# Patient Record
Sex: Female | Born: 1975 | Hispanic: No | Marital: Married | Smoking: Never smoker
Health system: Southern US, Community
[De-identification: ages and names within clinical notes are randomized; demographics above are authoritative.]

## PROBLEM LIST (undated history)

## (undated) DIAGNOSIS — N200 Calculus of kidney: Secondary | ICD-10-CM

## (undated) DIAGNOSIS — D649 Anemia, unspecified: Secondary | ICD-10-CM

## (undated) DIAGNOSIS — E039 Hypothyroidism, unspecified: Secondary | ICD-10-CM

## (undated) DIAGNOSIS — E079 Disorder of thyroid, unspecified: Secondary | ICD-10-CM

## (undated) HISTORY — PX: OTHER SURGICAL HISTORY: SHX169

---

## 2008-02-19 LAB — CONVERTED CEMR LAB: Pap Smear: NORMAL

## 2008-11-07 ENCOUNTER — Emergency Department: Payer: Self-pay | Admitting: Emergency Medicine

## 2008-11-24 ENCOUNTER — Emergency Department: Payer: Self-pay | Admitting: Emergency Medicine

## 2009-05-06 ENCOUNTER — Ambulatory Visit: Payer: Self-pay | Admitting: Family Medicine

## 2009-05-06 DIAGNOSIS — K644 Residual hemorrhoidal skin tags: Secondary | ICD-10-CM | POA: Insufficient documentation

## 2009-05-06 DIAGNOSIS — H9319 Tinnitus, unspecified ear: Secondary | ICD-10-CM | POA: Insufficient documentation

## 2009-05-06 DIAGNOSIS — D509 Iron deficiency anemia, unspecified: Secondary | ICD-10-CM | POA: Insufficient documentation

## 2009-06-29 ENCOUNTER — Ambulatory Visit: Payer: Self-pay | Admitting: Family Medicine

## 2009-07-20 ENCOUNTER — Ambulatory Visit: Payer: Self-pay | Admitting: Family Medicine

## 2009-07-20 LAB — CONVERTED CEMR LAB
Albumin: 4.1 g/dL (ref 3.5–5.2)
BUN: 17 mg/dL (ref 6–23)
Basophils Relative: 0.1 % (ref 0.0–3.0)
Bilirubin, Direct: 0.1 mg/dL (ref 0.0–0.3)
Chloride: 108 meq/L (ref 96–112)
Cholesterol: 131 mg/dL (ref 0–200)
Eosinophils Absolute: 0 10*3/uL (ref 0.0–0.7)
HCT: 37.6 % (ref 36.0–46.0)
LDL Cholesterol: 79 mg/dL (ref 0–99)
Lymphs Abs: 1.3 10*3/uL (ref 0.7–4.0)
MCHC: 34.3 g/dL (ref 30.0–36.0)
MCV: 89.3 fL (ref 78.0–100.0)
Monocytes Absolute: 0.4 10*3/uL (ref 0.1–1.0)
Neutro Abs: 3.8 10*3/uL (ref 1.4–7.7)
Neutrophils Relative %: 69.4 % (ref 43.0–77.0)
Potassium: 4.2 meq/L (ref 3.5–5.1)
RBC: 4.2 M/uL (ref 3.87–5.11)
Total Protein: 7 g/dL (ref 6.0–8.3)

## 2009-07-27 ENCOUNTER — Other Ambulatory Visit: Admission: RE | Admit: 2009-07-27 | Discharge: 2009-07-27 | Payer: Self-pay | Admitting: Family Medicine

## 2009-07-27 ENCOUNTER — Ambulatory Visit: Payer: Self-pay | Admitting: Family Medicine

## 2009-07-27 ENCOUNTER — Encounter: Payer: Self-pay | Admitting: Family Medicine

## 2009-07-27 DIAGNOSIS — J309 Allergic rhinitis, unspecified: Secondary | ICD-10-CM | POA: Insufficient documentation

## 2009-08-02 ENCOUNTER — Encounter (INDEPENDENT_AMBULATORY_CARE_PROVIDER_SITE_OTHER): Payer: Self-pay | Admitting: *Deleted

## 2009-09-29 ENCOUNTER — Ambulatory Visit: Payer: Self-pay | Admitting: Obstetrics & Gynecology

## 2009-09-29 LAB — CONVERTED CEMR LAB
Hemoglobin: 13.3 g/dL (ref 12.0–15.0)
Hepatitis B Surface Ag: NEGATIVE
Lymphocytes Relative: 16 % (ref 12–46)
Monocytes Absolute: 0.8 10*3/uL (ref 0.1–1.0)
Monocytes Relative: 9 % (ref 3–12)
Neutro Abs: 6.5 10*3/uL (ref 1.7–7.7)
Neutrophils Relative %: 75 % (ref 43–77)
RBC: 4.63 M/uL (ref 3.87–5.11)
Rh Type: POSITIVE
WBC: 8.8 10*3/uL (ref 4.0–10.5)

## 2009-10-04 ENCOUNTER — Ambulatory Visit: Payer: Self-pay | Admitting: Obstetrics and Gynecology

## 2009-10-04 LAB — CONVERTED CEMR LAB: Chlamydia, Swab/Urine, PCR: NEGATIVE

## 2009-10-28 ENCOUNTER — Inpatient Hospital Stay (HOSPITAL_COMMUNITY): Admission: AD | Admit: 2009-10-28 | Discharge: 2009-10-28 | Payer: Self-pay | Admitting: Obstetrics & Gynecology

## 2009-11-01 ENCOUNTER — Ambulatory Visit: Payer: Self-pay | Admitting: Obstetrics and Gynecology

## 2009-11-01 LAB — CONVERTED CEMR LAB
Hemoglobin: 13.1 g/dL (ref 12.0–15.0)
MCHC: 33.2 g/dL (ref 30.0–36.0)
MCV: 88.4 fL (ref 78.0–100.0)
RBC: 4.47 M/uL (ref 3.87–5.11)
WBC: 9.4 10*3/uL (ref 4.0–10.5)

## 2009-11-08 ENCOUNTER — Ambulatory Visit (HOSPITAL_COMMUNITY): Admission: RE | Admit: 2009-11-08 | Discharge: 2009-11-08 | Payer: Self-pay | Admitting: Obstetrics & Gynecology

## 2009-11-09 ENCOUNTER — Ambulatory Visit: Payer: Self-pay | Admitting: Obstetrics & Gynecology

## 2009-12-07 ENCOUNTER — Ambulatory Visit: Payer: Self-pay | Admitting: Obstetrics & Gynecology

## 2009-12-28 ENCOUNTER — Ambulatory Visit (HOSPITAL_COMMUNITY): Admission: RE | Admit: 2009-12-28 | Discharge: 2009-12-28 | Payer: Self-pay | Admitting: Obstetrics & Gynecology

## 2010-01-04 ENCOUNTER — Ambulatory Visit: Payer: Self-pay | Admitting: Obstetrics and Gynecology

## 2010-02-01 ENCOUNTER — Ambulatory Visit: Payer: Self-pay | Admitting: Obstetrics and Gynecology

## 2010-03-01 ENCOUNTER — Ambulatory Visit: Payer: Self-pay | Admitting: Obstetrics & Gynecology

## 2010-03-01 LAB — CONVERTED CEMR LAB
HCT: 36.3 % (ref 36.0–46.0)
MCHC: 31.1 g/dL (ref 30.0–36.0)
MCV: 96.8 fL (ref 78.0–100.0)
RBC: 3.75 M/uL — ABNORMAL LOW (ref 3.87–5.11)

## 2010-03-22 ENCOUNTER — Ambulatory Visit: Payer: Self-pay | Admitting: Obstetrics & Gynecology

## 2010-04-09 IMAGING — US US OB COMP LESS 14 WK
1 series · 14 of 28 positions shown · non-contrast
Comparison: none

OBSTETRICAL ULTRASOUND:
 This ultrasound exam was performed in the [HOSPITAL] Ultrasound Department.  The OB US report was generated in the AS system, and faxed to the ordering physician.  This report is also available in [HOSPITAL]?s Access[employer] and in [REDACTED] PACS.

[Series 1: us ob comp less 14 wks · 0.16mm/px · 32 acquisitions, 14 frames shown]
[im 2/32]
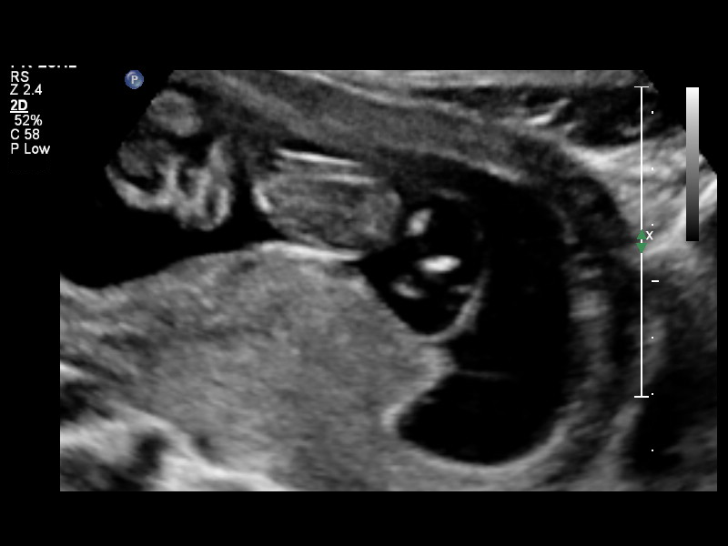
[im 4/32]
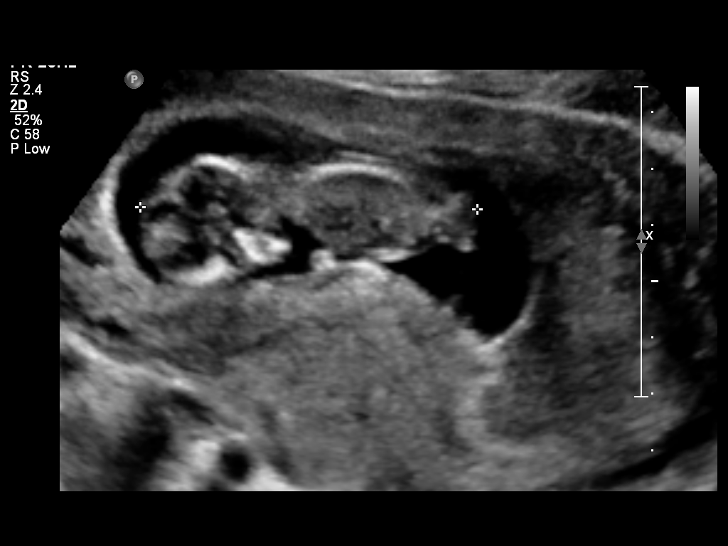
[im 6/32]
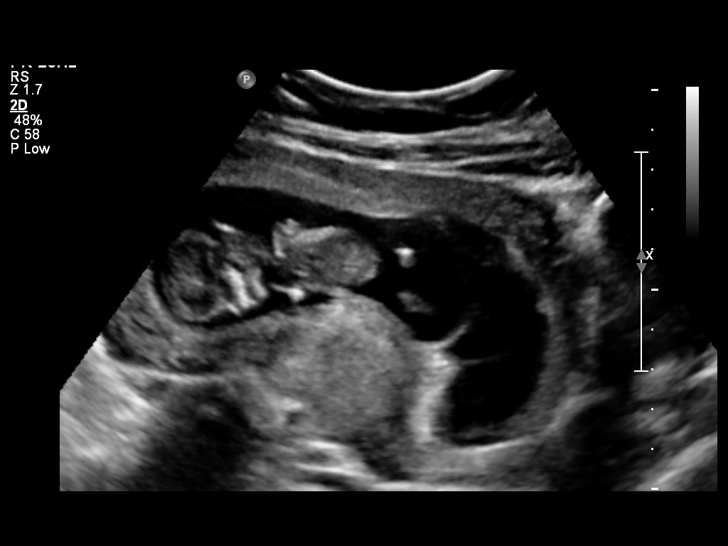
[im 9/32]
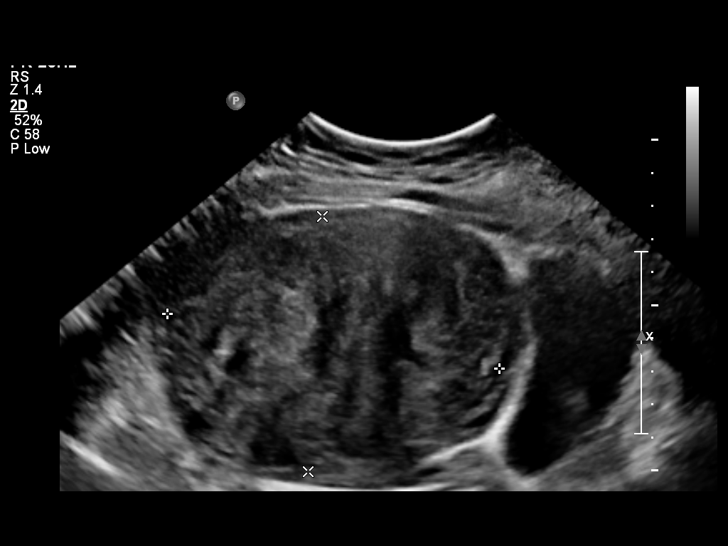
[im 11/32]
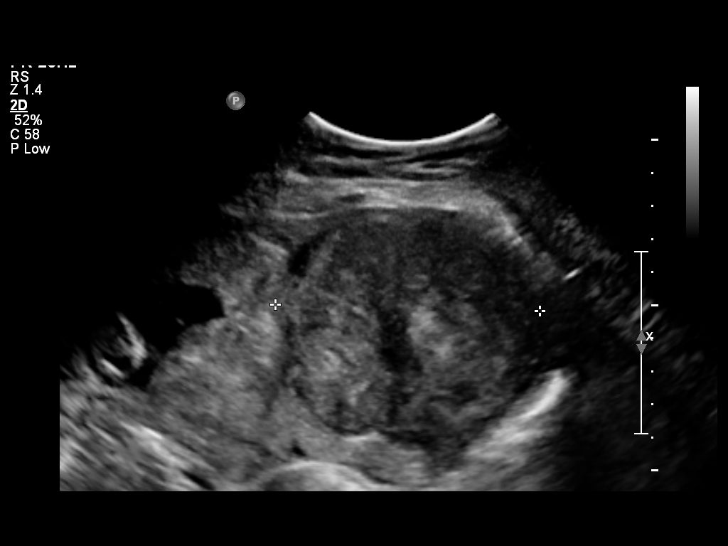
[im 13/32]
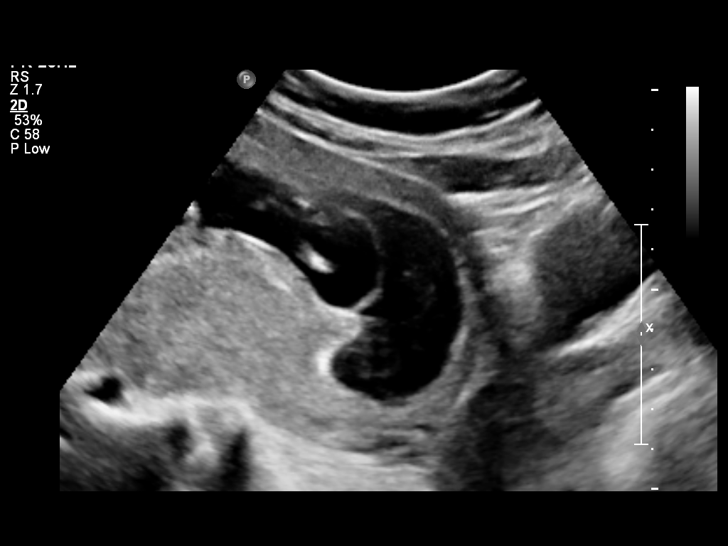
[im 15/32]
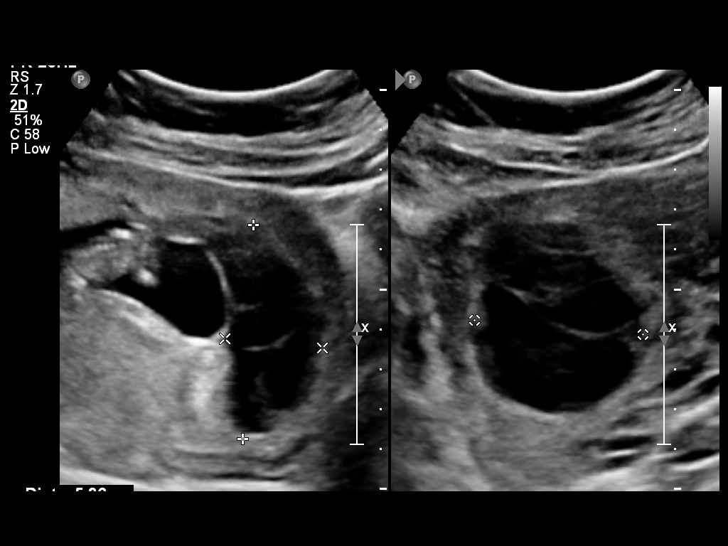
[im 18/32]
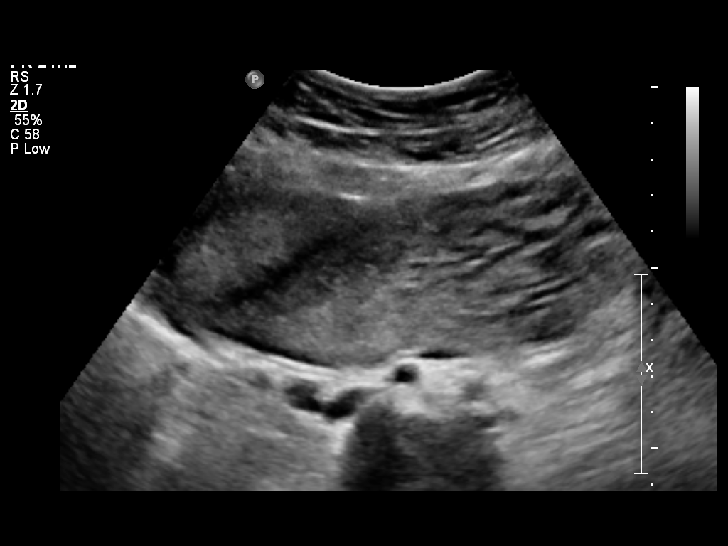
[im 20/32]
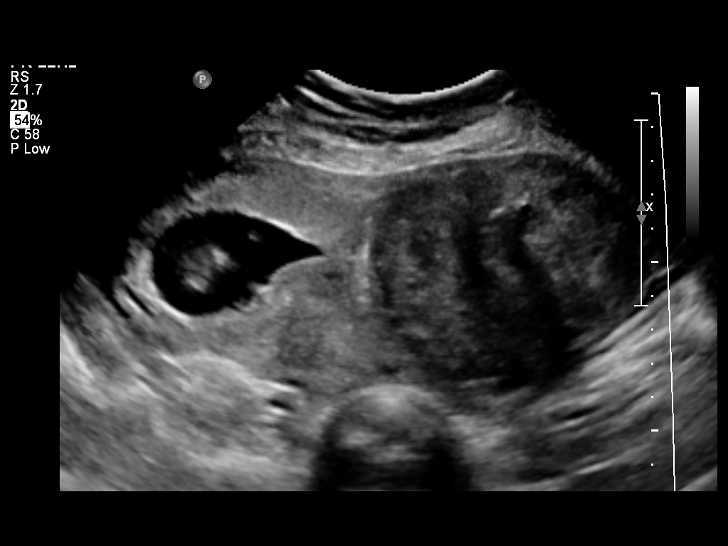
[im 22/32]
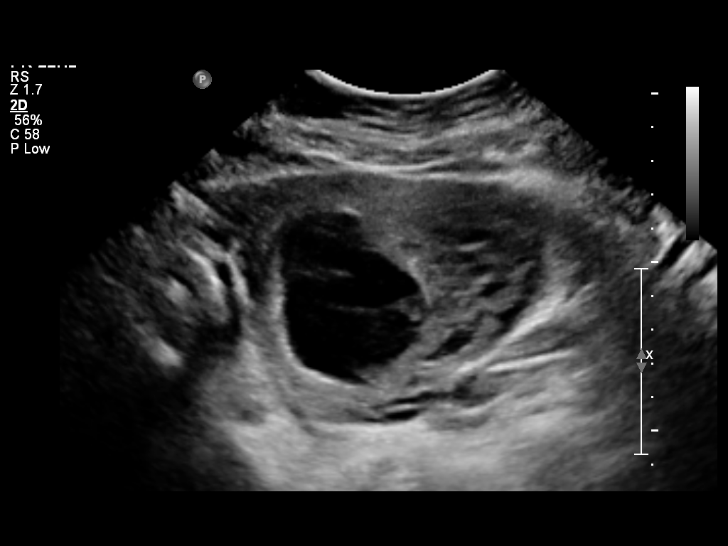
[im 25/32]
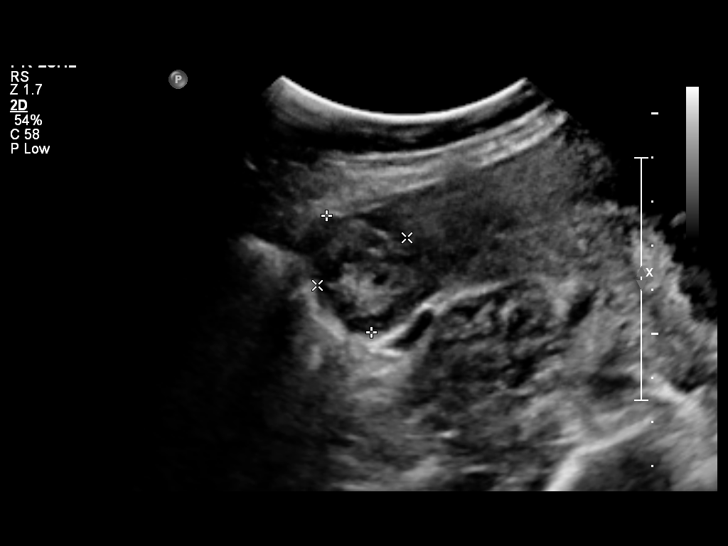
[im 27/32]
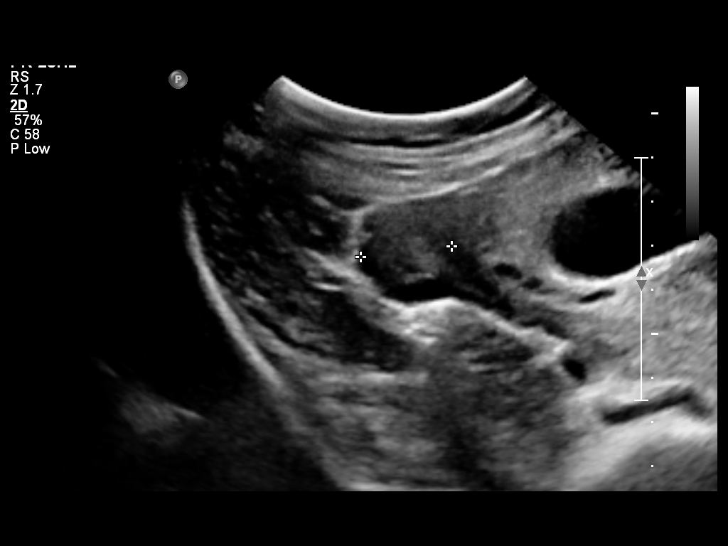
[im 29/32]
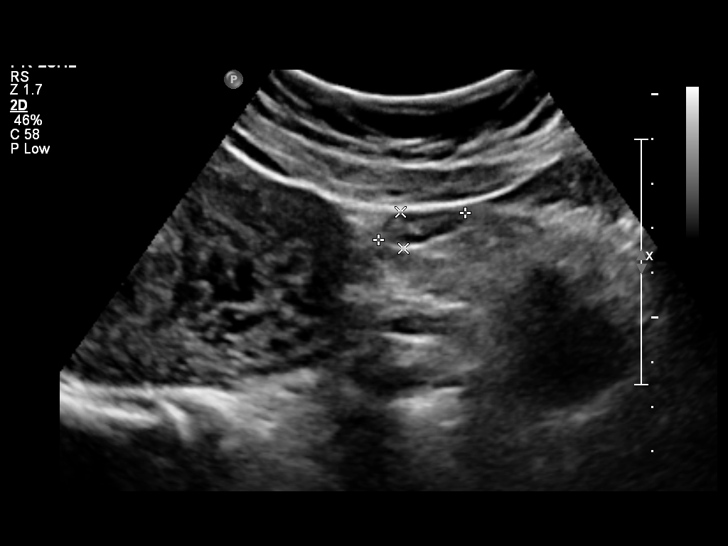
[im 32/32]
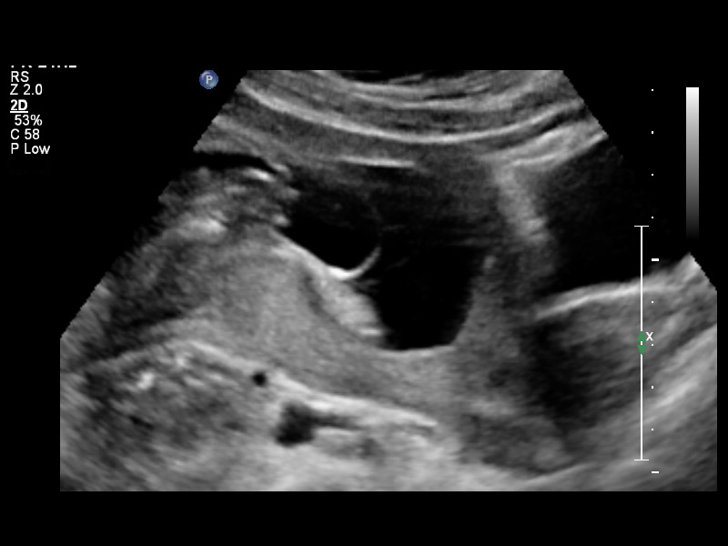

[14 of 28 positions shown; findings below may reference images not displayed]

IMPRESSION: See AS Obstetric US report.

## 2010-04-12 ENCOUNTER — Ambulatory Visit: Payer: Self-pay | Admitting: Family Medicine

## 2010-04-26 ENCOUNTER — Ambulatory Visit: Payer: Self-pay | Admitting: Obstetrics & Gynecology

## 2010-04-26 ENCOUNTER — Telehealth: Payer: Self-pay | Admitting: Family Medicine

## 2010-04-26 LAB — CONVERTED CEMR LAB: Yeast Wet Prep HPF POC: NONE SEEN

## 2010-05-03 ENCOUNTER — Ambulatory Visit: Payer: Self-pay | Admitting: Obstetrics & Gynecology

## 2010-05-10 ENCOUNTER — Ambulatory Visit: Payer: Self-pay | Admitting: Obstetrics & Gynecology

## 2010-05-11 ENCOUNTER — Ambulatory Visit: Payer: Self-pay | Admitting: Obstetrics & Gynecology

## 2010-05-12 ENCOUNTER — Inpatient Hospital Stay (HOSPITAL_COMMUNITY): Admission: AD | Admit: 2010-05-12 | Discharge: 2010-05-15 | Payer: Self-pay | Admitting: Obstetrics & Gynecology

## 2010-05-12 ENCOUNTER — Ambulatory Visit: Payer: Self-pay | Admitting: Obstetrics & Gynecology

## 2010-05-24 ENCOUNTER — Ambulatory Visit: Payer: Self-pay | Admitting: Family Medicine

## 2010-06-14 ENCOUNTER — Ambulatory Visit: Payer: Self-pay | Admitting: Obstetrics & Gynecology

## 2010-10-25 ENCOUNTER — Ambulatory Visit: Payer: Self-pay | Admitting: Family Medicine

## 2010-11-18 ENCOUNTER — Telehealth (INDEPENDENT_AMBULATORY_CARE_PROVIDER_SITE_OTHER): Payer: Self-pay | Admitting: *Deleted

## 2010-11-22 ENCOUNTER — Ambulatory Visit
Admission: RE | Admit: 2010-11-22 | Discharge: 2010-11-22 | Payer: Self-pay | Source: Home / Self Care | Attending: Family Medicine | Admitting: Family Medicine

## 2010-11-22 LAB — CONVERTED CEMR LAB
ALT: 13 units/L (ref 0–35)
AST: 16 units/L (ref 0–37)
Alkaline Phosphatase: 70 units/L (ref 39–117)
BUN: 20 mg/dL (ref 6–23)
Basophils Relative: 0.3 % (ref 0.0–3.0)
Chloride: 104 meq/L (ref 96–112)
Cholesterol: 171 mg/dL (ref 0–200)
Eosinophils Relative: 0.5 % (ref 0.0–5.0)
Glucose, Bld: 76 mg/dL (ref 70–99)
Hemoglobin: 12.9 g/dL (ref 12.0–15.0)
LDL Cholesterol: 103 mg/dL — ABNORMAL HIGH (ref 0–99)
Lymphocytes Relative: 26.5 % (ref 12.0–46.0)
MCHC: 33.6 g/dL (ref 30.0–36.0)
Monocytes Relative: 7 % (ref 3.0–12.0)
Neutro Abs: 3.8 10*3/uL (ref 1.4–7.7)
Potassium: 4.3 meq/L (ref 3.5–5.1)
RBC: 4.22 M/uL (ref 3.87–5.11)
Sodium: 139 meq/L (ref 135–145)
Total Bilirubin: 0.5 mg/dL (ref 0.3–1.2)
Total CHOL/HDL Ratio: 3
WBC: 5.8 10*3/uL (ref 4.5–10.5)

## 2010-11-25 ENCOUNTER — Ambulatory Visit
Admission: RE | Admit: 2010-11-25 | Discharge: 2010-11-25 | Payer: Self-pay | Source: Home / Self Care | Attending: Family Medicine | Admitting: Family Medicine

## 2010-11-25 ENCOUNTER — Other Ambulatory Visit: Payer: Self-pay | Admitting: Family Medicine

## 2010-11-25 LAB — TSH: TSH: 7.27 u[IU]/mL — ABNORMAL HIGH (ref 0.35–5.50)

## 2010-11-25 LAB — B12 AND FOLATE PANEL
Folate: >20 ng/mL
Vitamin B-12: 631 pg/mL (ref 211–911)

## 2010-11-30 ENCOUNTER — Ambulatory Visit
Admission: RE | Admit: 2010-11-30 | Discharge: 2010-11-30 | Payer: Self-pay | Source: Home / Self Care | Attending: Family Medicine | Admitting: Family Medicine

## 2010-11-30 ENCOUNTER — Other Ambulatory Visit: Payer: Self-pay | Admitting: Family Medicine

## 2010-11-30 LAB — T3, FREE: T3, Free: 2 pg/mL — ABNORMAL LOW (ref 2.3–4.2)

## 2010-11-30 LAB — T4, FREE: Free T4: 0.6 ng/dL (ref 0.60–1.60)

## 2010-12-03 DIAGNOSIS — E039 Hypothyroidism, unspecified: Secondary | ICD-10-CM | POA: Insufficient documentation

## 2010-12-20 ENCOUNTER — Encounter: Payer: Self-pay | Admitting: Family Medicine

## 2010-12-20 NOTE — Progress Notes (Signed)
Summary: regarding newborn appt  Phone Note Call from Patient Call back at Home Phone [phone]   Caller: Hulen Skains Call For: Kerby Nora MD Summary of Call: Pt is due to deliver in july and husband is asking if you will see the baby as a new pt.  Do they just call  when the baby is born to schedule appt? Initial call taken by: Lowella Petties CMA,  April 26, 2010 11:33 AM  Follow-up for Phone Call        Yes I would love to see her. yes just call when baby born for the appt in 1 week.  Follow-up by: Kerby Nora MD,  April 26, 2010 12:01 PM  Additional Follow-up for Phone Call Additional follow up Details #1::        Patient advised.Consuello Masse CMA  Additional Follow-up by: Benny Lennert CMA Duncan Dull),  April 26, 2010 2:03 PM

## 2010-12-22 NOTE — Assessment & Plan Note (Signed)
Summary: CPX/RBH   Vital Signs:  Patient profile:   35 year old female Height:      63.75 inches Weight:      120.50 pounds BMI:     20.92 Temp:     98.0 degrees F oral Pulse rate:   72 / minute Pulse rhythm:   regular BP sitting:   100 / 70  (left arm) Cuff size:   regular  Vitals Entered By: Benny Lennert CMA Duncan Dull) (November 25, 2010 2:00 PM)  History of Present Illness: Chief complaint dizziness   English is second language for pt.    In last 2 months  ... dizziness when getting up in AMs. Feels like room is spinning.  Continues more mildly through rest of day. Difficult to determine if movement worsens. No lightheadedness or syncope.  No cold symptoms or allergy. Occ skipping meals..but dizziness does not get worsen.  Has baby 10 months old.  Breast feeding. Having irregular menses...not heavy flow.  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 Take 2-3 Tablets By Mouth Daily 2)  Prenavite Multiple Vitamin 28-0.8 Mg Tabs (Prenatal Vit-Fe Fumarate-Fa) .... One Tablet Dialy  Allergies (verified): No Known Drug Allergies  Past History:  Past medical, surgical, family and social histories (including risk factors) reviewed, and no changes noted (except as noted below).  Past Medical History: Reviewed history from 05/06/2009 and no changes required. Anemia-iron deficiency  Past Surgical History: Reviewed history from 05/06/2009 and no changes required. left axillae surgery  age 67ish..? cyst removal misscarriage,   2010 D and C  Family History: Reviewed history from 05/06/2009 and no changes required. father: CVA, HTN, deceased mother :healthy sister: healthy PGF: CVA no cancer no CAD  Social History: Reviewed history from 05/06/2009 and no changes required. 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 CV:  Denies chest pain or discomfort. Resp:  Denies shortness of breath. GI:  Denies abdominal pain, bloody stools, constipation, and diarrhea. GU:  Denies dysuria.  Physical Exam  General:  Well-developed,well-nourished,in no acute distress; alert,appropriate and cooperative throughout examination Eyes:  No corneal or conjunctival inflammation noted. EOMI. Perrla. Funduscopic exam benign, without hemorrhages, exudates or papilledema. Vision grossly normal. Ears:  External ear exam shows no significant lesions or deformities.  Otoscopic examination reveals clear canals, tympanic membranes are intact bilaterally without bulging, retraction, inflammation or discharge. Hearing is grossly normal bilaterally. Nose:  External nasal examination shows no deformity or inflammation. Nasal mucosa are pink and moist without lesions or exudates. Mouth:  Oral mucosa and oropharynx without lesions or exudates.  Teeth in good repair. Neck:  no carotid bruit or thyromegaly no cervical or supraclavicular lymphadenopathy  Lungs:  Normal respiratory effort, chest expands symmetrically. Lungs are clear to auscultation, no crackles or wheezes. Heart:  Normal rate and regular rhythm. S1 and S2 normal  without gallop, murmur, click, rub or other extra sounds. Abdomen:  Bowel sounds positive,abdomen soft and non-tender without masses, organomegaly or hernias noted. Msk:  No  deformity or scoliosis noted of thoracic or lumbar spine.   Pulses:  R and L carotid,radial,femoral,dorsalis pedis and posterior tibial pulses are full and equal bilaterally Extremities:  No clubbing, cyanosis, edema, or deformity noted with normal full range of motion of all joints.   Neurologic:  No cranial nerve deficits noted. Station and gait are normal. Plantar reflexes are down-going bilaterally. DTRs are symmetrical throughout. Sensory, motor and coordinative functions appear intact. Skin:  Intact without suspicious lesions or rashes Psych:  Cognition and judgment appear intact. Alert and cooperative with normal attention span and concentration. No apparent delusions, illusions, hallucinations   Impression & Recommendations:  Problem # 1:  DIZZINESS (ICD-780.4)  Difficult to determine if vertigo or lightheadedness. cbc nml. Will send for TSh and vit D. Also encourage water and regular eating as she is breast feeding.   Neg Dix HAllpike  on exam but she describes most as room spinning sensation. If above not improving.. Consider ENT eval.   Orders: TLB-TSH (Thyroid Stimulating Hormone) (84443-TSH) TLB-B12 + Folate Pnl ([id number]-Y86/VHQ)  Complete Medication List: 1)  Fish Oil Oil (Fish oil) .... Take 2-3 tablets by mouth daily 2)  Prenavite Multiple Vitamin 28-0.8 Mg Tabs (Prenatal vit-fe fumarate-fa) .... One tablet dialy  Patient Instructions: 1)  Schedule CPX in 3 months for PAP.  No labs prior needed.  2)  Increase water. Don't skip meals.    Orders Added: 1)  Est. Patient Level III [46962] 2)  TLB-TSH (Thyroid Stimulating Hormone) [84443-TSH] 3)  TLB-B12 + Folate Pnl [[id number]-B12/FOL]    Current Allergies (reviewed today): No known allergies   Last PAP:  NEGATIVE FOR INTRAEPITHELIAL LESIONS OR MALIGNANCY. (07/27/2009 12:00:00 AM) PAP Result Date:  01/18/2010 PAP Result:  normal PAP Next Due:  1 yr

## 2010-12-22 NOTE — Progress Notes (Signed)
----   Converted from flag ---- ---- 11/17/2010 10:20 PM, Kerby Nora MD wrote: CMET, lipids, cbc Dx v77.91, 280.9  ---- 11/16/2010 9:56 AM, Liane Comber CMA (AAMA) wrote: Lab orders please! Good Morning! This pt is scheduled for cpx labs Monday, which labs to draw and dx codes to use? Thanks Tasha ------------------------------

## 2010-12-28 ENCOUNTER — Telehealth: Payer: Self-pay | Admitting: Family Medicine

## 2011-01-02 ENCOUNTER — Other Ambulatory Visit: Payer: Self-pay

## 2011-01-04 ENCOUNTER — Encounter (INDEPENDENT_AMBULATORY_CARE_PROVIDER_SITE_OTHER): Payer: Self-pay | Admitting: *Deleted

## 2011-01-04 ENCOUNTER — Other Ambulatory Visit (INDEPENDENT_AMBULATORY_CARE_PROVIDER_SITE_OTHER): Payer: BC Managed Care – PPO

## 2011-01-04 ENCOUNTER — Other Ambulatory Visit: Payer: Self-pay | Admitting: Family Medicine

## 2011-01-04 DIAGNOSIS — E039 Hypothyroidism, unspecified: Secondary | ICD-10-CM

## 2011-01-04 DIAGNOSIS — E079 Disorder of thyroid, unspecified: Secondary | ICD-10-CM

## 2011-01-04 LAB — TSH: TSH: 7.92 u[IU]/mL — ABNORMAL HIGH (ref 0.35–5.50)

## 2011-01-05 NOTE — Progress Notes (Signed)
Summary: headaches since starting thyroid medicine  Phone Note Call from Patient Call back at Home Phone [phone]   Caller: Hulen Skains Summary of Call: Pt has been taking levothyroxine for about 2-3 weeks and has been having headaches since starting- these happen in the afternoons and husband is asking if this is normal-  she didnt have headaches before. Initial call taken by: Lowella Petties CMA, AAMA,  December 28, 2010 1:07 PM  Follow-up for Phone Call        it is possible. since Dr. B here in AM, i would rather defer to her on whether or not she would change thryoid meds. Follow-up by: Hannah Beat MD,  December 28, 2010 1:54 PM  Additional Follow-up for Phone Call Additional follow up Details #1::        Lets bring her in early to check TSH to see if overtreated etc.  TSH Dx 244.9 Additional Follow-up by: Kerby Nora MD,  December 28, 2010 11:14 PM    Additional Follow-up for Phone Call Additional follow up Details #2::    Patients husband advised.Consuello Masse CMA   Follow-up by: Benny Lennert CMA Duncan Dull),  December 29, 2010 8:17 AM

## 2011-01-11 NOTE — Letter (Signed)
Summary: [facility] Patient Medical Records  [facility] Patient Medical Records   Imported By: Kassie Mends 01/02/2011 11:15:21  _____________________________________________________________________  External Attachment:    Type:   Image     Comment:   External Document

## 2011-02-05 LAB — CBC
HCT: 38.6 % (ref 36.0–46.0)
Hemoglobin: 12.7 g/dL (ref 12.0–15.0)
MCV: 96.7 fL (ref 78.0–100.0)
RBC: 3.99 MIL/uL (ref 3.87–5.11)
WBC: 9.4 10*3/uL (ref 4.0–10.5)

## 2011-02-21 LAB — URINE MICROSCOPIC-ADD ON

## 2011-02-21 LAB — WET PREP, GENITAL: Trich, Wet Prep: NONE SEEN

## 2011-02-21 LAB — CBC
HCT: 39.5 % (ref 36.0–46.0)
Hemoglobin: 13.4 g/dL (ref 12.0–15.0)
MCHC: 33.9 g/dL (ref 30.0–36.0)
Platelets: 197 10*3/uL (ref 150–400)
RDW: 14.7 % (ref 11.5–15.5)

## 2011-02-21 LAB — URINALYSIS, ROUTINE W REFLEX MICROSCOPIC
Glucose, UA: NEGATIVE mg/dL
Leukocytes, UA: NEGATIVE
Specific Gravity, Urine: 1.025 (ref 1.005–1.030)

## 2011-02-21 LAB — GC/CHLAMYDIA PROBE AMP, GENITAL: GC Probe Amp, Genital: NEGATIVE

## 2011-02-21 LAB — ABO/RH: ABO/RH(D): O POS

## 2011-02-21 LAB — POCT PREGNANCY, URINE: Preg Test, Ur: POSITIVE

## 2011-02-22 ENCOUNTER — Other Ambulatory Visit: Payer: Self-pay | Admitting: Family Medicine

## 2011-02-22 DIAGNOSIS — E039 Hypothyroidism, unspecified: Secondary | ICD-10-CM

## 2011-02-23 ENCOUNTER — Other Ambulatory Visit (INDEPENDENT_AMBULATORY_CARE_PROVIDER_SITE_OTHER): Payer: BC Managed Care – PPO | Admitting: Family Medicine

## 2011-02-23 DIAGNOSIS — E039 Hypothyroidism, unspecified: Secondary | ICD-10-CM

## 2011-02-23 LAB — TSH: TSH: 6.73 u[IU]/mL — ABNORMAL HIGH (ref 0.35–5.50)

## 2011-02-25 ENCOUNTER — Telehealth: Payer: Self-pay | Admitting: Family Medicine

## 2011-02-25 DIAGNOSIS — E039 Hypothyroidism, unspecified: Secondary | ICD-10-CM

## 2011-02-25 NOTE — Telephone Encounter (Signed)
Notify pt that thyroid is better but still under treated. Will increase thyroid medication  But find out what dose she is on currently.[name] find on last LAB result. Let me know so I can send in new rx... Also enter pharmacy please. Schedule TSH in 4-6 weeks... Order entered.

## 2011-03-01 NOTE — Telephone Encounter (Signed)
Notified patients husband he will ask wife what dosage medication she is on and they use [facility] [address] and they will schedule follow up labs in 4-6 weeks

## 2011-03-03 NOTE — Telephone Encounter (Signed)
Have they called back with dosage yet? If not please call again.

## 2011-03-08 MED ORDER — LEVOTHYROXINE SODIUM 50 MCG PO TABS: 50.0000 ug | ORAL_TABLET | Freq: Every day | ORAL | Status: DC

## 2011-03-08 NOTE — Telephone Encounter (Signed)
Patient is currently taken 25 mg of thyroid medication

## 2011-03-08 NOTE — Telephone Encounter (Signed)
New prescription for higher dose sent in. Make sure appt for 4-6 week TSH scheduled. I put order in EMR.

## 2011-03-10 NOTE — Telephone Encounter (Signed)
Patients husband advised and will call back for appt

## 2011-04-04 NOTE — Assessment & Plan Note (Signed)
[name], [name] NO.:  [id number]   MEDICAL RECORD NO.:  [id number]          PATIENT TYPE:  POB   LOCATION:  [facility]         FACILITY:  [facility]   PHYSICIAN:  Tinnie Gens, MD        DATE OF BIRTH:  Sep 25, 1976   DATE OF SERVICE:  05/24/2010                                  CLINIC NOTE   CHIEF COMPLAINT:  Hemorrhoids.   HISTORY OF PRESENT ILLNESS:  The patient is a 35 year old Asian female  who is 2 weeks status post vaginal delivery of 7-pound and 0-ounce  female who is currently breast and bottle-feeding.  The patient has a  large amount of hemorrhoids and was having difficulty urinating;  however, her urination problem has resolved, but her hemorrhoids  remained.  She is using Tucks pads presently.   PHYSICAL EXAMINATION:  VITAL SIGNS:  Vitals are as noted in the chart.  GENERAL:  She is a well-developed, well-nourished female, in no acute  distress.  ABDOMEN:  Soft, nontender, nondistended.  GU:  Normal external female genitalia, healing midline episiotomy.  Urethra appears normal.  The rectum has got some moderate amount of soft  hemorrhoids externally that are still swollen.   IMPRESSION:  Moderate hemorrhoids.   PLAN:  ProctoFoam and discontinue sitz baths as needed, Tucks pads as  well.           ______________________________  Tinnie Gens, MD     TP/MEDQ  D:  05/24/2010  T:  05/25/2010  Job:  [id number]

## 2011-04-04 NOTE — Assessment & Plan Note (Signed)
[name], [name] NO.:  [id number]   MEDICAL RECORD NO.:  [id number]          PATIENT TYPE:  POB   LOCATION:  [facility]         [facility]AC[facility]  [facility]   PHYSICIAN:  Scheryl Darter, MD       DATE OF BIRTH:  1975/12/25   DATE OF SERVICE:                                  CLINIC NOTE   The patient's postpartum vaginal delivery on May 13, 2010.  She had a  vaginal delivery with a 7 pounds female, currently breast and bottle  feeding and baby is doing well.  She has had problems with hemorrhoids.  She notes some burning of the site of her midline episiotomy, but no  swelling or overt pain.  Says hemorrhoids are not bothering her at this  point.  She wished to use condoms for birth control.  We discussed about  a good interval to wait to conceive and is suggested it might be healthy  to wait until baby is a year old before trying to conceive again.  Says  that in general she sleeps well.   PHYSICAL EXAMINATION:  GENERAL:  Her affect seems normal and her husband  is interpreting for her.  VITAL SIGNS:  Weight 144 pounds, blood pressure 116/92.  ABDOMEN:  Soft, nontender, no mass.  PELVIC:  External genitalia showed the fragments of the suture material  at the episiotomy site, but otherwise this is healing well.  External  hemorrhoids are noted.  Vagina and cervix appeared normal.  Uterus  normal size, nontender, no mass.   IMPRESSION:  The patient is recovering well from vaginal delivery with  episiotomy.  Her hemorrhoids are still present, but less troublesome  now.  She uses condoms for birth control.   PLAN:  The patient will return in 6 months for yearly exam, but she  should note if she has any problems in the meantime.      Scheryl Darter, MD     JA/MEDQ  D:  06/14/2010  T:  06/15/2010  Job:  [id number]

## 2011-09-11 ENCOUNTER — Ambulatory Visit: Payer: BC Managed Care – PPO | Admitting: Obstetrics & Gynecology

## 2011-11-21 NOTE — L&D Delivery Note (Signed)
Delivery Note At 7:58 PM a viable and healthy female was delivered via Vaginal, Spontaneous Delivery (Presentation: Right Occiput Anterior) with loose nuchal cord x1, reduced prior to delivery of shoulders.  APGAR: 8, 9; weight 7 lb 3 oz (3260 g).   Placenta status: Intact, Spontaneous.  Cord: 3 vessels with the following complications: None.   Anesthesia: Epidural  Episiotomy: None Lacerations: 2nd degree;Perineal Suture Repair: 3.0 vicryl  Est. Blood Loss (mL): 350 ml  Infant placed in mom's arms immediately following delivery.  Family bonding well.  Mom to postpartum.  Baby to nursery-stable.  LEFTWICH-KIRBY, LISA 09/12/2012, 8:42 PM

## 2012-01-01 ENCOUNTER — Ambulatory Visit (INDEPENDENT_AMBULATORY_CARE_PROVIDER_SITE_OTHER): Payer: BC Managed Care – PPO | Admitting: Family Medicine

## 2012-01-01 VITALS — BP 113/71 | Wt 138.0 lb

## 2012-01-01 DIAGNOSIS — Z3490 Encounter for supervision of normal pregnancy, unspecified, unspecified trimester: Secondary | ICD-10-CM

## 2012-01-01 DIAGNOSIS — O99280 Endocrine, nutritional and metabolic diseases complicating pregnancy, unspecified trimester: Secondary | ICD-10-CM

## 2012-01-01 DIAGNOSIS — O3680X0 Pregnancy with inconclusive fetal viability, not applicable or unspecified: Secondary | ICD-10-CM

## 2012-01-01 DIAGNOSIS — O3410 Maternal care for benign tumor of corpus uteri, unspecified trimester: Secondary | ICD-10-CM

## 2012-01-01 DIAGNOSIS — D259 Leiomyoma of uterus, unspecified: Secondary | ICD-10-CM

## 2012-01-01 DIAGNOSIS — E039 Hypothyroidism, unspecified: Secondary | ICD-10-CM

## 2012-01-01 DIAGNOSIS — Z3480 Encounter for supervision of other normal pregnancy, unspecified trimester: Secondary | ICD-10-CM

## 2012-01-01 NOTE — Progress Notes (Signed)
Patient is here today for new Ob work up.  Her LMP was approximately the first week of January and her cycles are regular.  She had a positive home pregnancy test last week.  Unable to see gestational sac on bedside ultrasound.  Today I have drawn blood to check her quant level and ordered an ultrasound at the hospital for two weeks to confirm her pregnancy.  If the HCG quant comes back that we should have been able to see something more we will have ultrasound done sooner.  Patient is having no pelvic discomfort at all as well as no bleeding or spotting.  She is doing well and feels as if she is just very early pregnant.  (Her mother had a dream that she was and this is why she took the test to begin with.)

## 2012-01-02 LAB — OBSTETRIC PANEL
Basophils Absolute: 0 10*3/uL (ref 0.0–0.1)
Hepatitis B Surface Ag: NEGATIVE
Lymphocytes Relative: 20 % (ref 12–46)
Lymphs Abs: 1.3 10*3/uL (ref 0.7–4.0)
MCV: 91 fL (ref 78.0–100.0)
Neutro Abs: 4.6 10*3/uL (ref 1.7–7.7)
Platelets: 255 10*3/uL (ref 150–400)
RBC: 4.44 MIL/uL (ref 3.87–5.11)
RDW: 13.5 % (ref 11.5–15.5)
Rubella: 74.1 IU/mL — ABNORMAL HIGH
WBC: 6.5 10*3/uL (ref 4.0–10.5)

## 2012-01-02 LAB — HCG, QUANTITATIVE, PREGNANCY: hCG, Beta Chain, Quant, S: 5703.4 m[IU]/mL

## 2012-01-02 LAB — HIV ANTIBODY (ROUTINE TESTING W REFLEX): HIV: NONREACTIVE

## 2012-01-04 ENCOUNTER — Telehealth: Payer: Self-pay | Admitting: *Deleted

## 2012-01-04 ENCOUNTER — Other Ambulatory Visit (INDEPENDENT_AMBULATORY_CARE_PROVIDER_SITE_OTHER): Payer: BC Managed Care – PPO | Admitting: *Deleted

## 2012-01-04 DIAGNOSIS — Z3480 Encounter for supervision of other normal pregnancy, unspecified trimester: Secondary | ICD-10-CM

## 2012-01-04 LAB — URINE CULTURE

## 2012-01-04 NOTE — Telephone Encounter (Signed)
Patient is requesting her results for her HCG quant.  The numbers are 5703, she will come today to redraw this level to check and see if it is going up appropriately since her LMP date is not matching her bedside ultrasound.

## 2012-01-05 LAB — HCG, QUANTITATIVE, PREGNANCY: hCG, Beta Chain, Quant, S: 20579.4 m[IU]/mL

## 2012-01-16 ENCOUNTER — Encounter: Payer: Self-pay | Admitting: Obstetrics & Gynecology

## 2012-01-16 ENCOUNTER — Ambulatory Visit (HOSPITAL_COMMUNITY)
Admission: RE | Admit: 2012-01-16 | Discharge: 2012-01-16 | Disposition: A | Discharge status: Home / Self Care | Payer: BC Managed Care – PPO | Source: Ambulatory Visit | Attending: Obstetrics & Gynecology | Admitting: Obstetrics & Gynecology

## 2012-01-16 DIAGNOSIS — O468X9 Other antepartum hemorrhage, unspecified trimester: Secondary | ICD-10-CM | POA: Insufficient documentation

## 2012-01-16 DIAGNOSIS — Z3689 Encounter for other specified antenatal screening: Secondary | ICD-10-CM | POA: Insufficient documentation

## 2012-01-16 DIAGNOSIS — E039 Hypothyroidism, unspecified: Secondary | ICD-10-CM | POA: Insufficient documentation

## 2012-01-16 DIAGNOSIS — O3680X0 Pregnancy with inconclusive fetal viability, not applicable or unspecified: Secondary | ICD-10-CM

## 2012-01-16 DIAGNOSIS — O0990 Supervision of high risk pregnancy, unspecified, unspecified trimester: Secondary | ICD-10-CM | POA: Insufficient documentation

## 2012-01-16 DIAGNOSIS — O418X90 Other specified disorders of amniotic fluid and membranes, unspecified trimester, not applicable or unspecified: Secondary | ICD-10-CM | POA: Insufficient documentation

## 2012-01-16 DIAGNOSIS — D259 Leiomyoma of uterus, unspecified: Secondary | ICD-10-CM | POA: Insufficient documentation

## 2012-01-23 ENCOUNTER — Ambulatory Visit (INDEPENDENT_AMBULATORY_CARE_PROVIDER_SITE_OTHER): Payer: BC Managed Care – PPO | Admitting: Obstetrics and Gynecology

## 2012-01-23 DIAGNOSIS — E039 Hypothyroidism, unspecified: Secondary | ICD-10-CM

## 2012-01-23 DIAGNOSIS — E079 Disorder of thyroid, unspecified: Secondary | ICD-10-CM

## 2012-01-23 DIAGNOSIS — Z3480 Encounter for supervision of other normal pregnancy, unspecified trimester: Secondary | ICD-10-CM

## 2012-01-23 DIAGNOSIS — D259 Leiomyoma of uterus, unspecified: Secondary | ICD-10-CM

## 2012-01-23 DIAGNOSIS — O3410 Maternal care for benign tumor of corpus uteri, unspecified trimester: Secondary | ICD-10-CM

## 2012-01-23 DIAGNOSIS — Z113 Encounter for screening for infections with a predominantly sexual mode of transmission: Secondary | ICD-10-CM

## 2012-01-23 DIAGNOSIS — Z3490 Encounter for supervision of normal pregnancy, unspecified, unspecified trimester: Secondary | ICD-10-CM

## 2012-01-23 DIAGNOSIS — Z1272 Encounter for screening for malignant neoplasm of vagina: Secondary | ICD-10-CM

## 2012-01-23 NOTE — Progress Notes (Signed)
   Subjective:    [name] is a G3P1011 [redacted]w[redacted]d being seen today for her first obstetrical visit.  Her obstetrical history is significant for hypothyroidism. Patient does intend to breast feed. Pregnancy history fully reviewed.  Patient reports no complaints.  Filed Vitals:   01/23/12 1000  BP: 106/67  Weight: 141 lb (63.957 kg)    HISTORY: OB History    Grav Para Term Preterm Abortions TAB SAB Ect Mult Living   3 1 1  0 1 0 1 0 0 1     # Outc Date GA Lbr Len/2nd Wgt Sex Del Anes PTL Lv   1 TRM 6/11 [redacted]w[redacted]d   F SVD EPI No Yes   2 SAB            3 CUR              No past medical history on file. Past Surgical History  Procedure Date  . Hemrrhoidectomy     age 35 or 34   Family History  Problem Relation Age of Onset  . Stroke Father      Exam    Uterine Size: size greater than dates and fibroid uterus  Pelvic Exam:    Perineum: No Hemorrhoids, Normal Perineum   Vulva: normal   Vagina:  normal mucosa, normal discharge   pH:    Cervix: normal   Adnexa: no mass, fullness, tenderness   Bony Pelvis: adequate  System: Breast:  normal appearance, no masses or tenderness, No nipple retraction or dimpling, No nipple discharge or bleeding   Skin: normal coloration and turgor, no rashes    Neurologic: oriented, normal, grossly non-focal   Extremities: normal strength, tone, and muscle mass, ROM of all joints is normal   HEENT extra ocular movement intact   Mouth/Teeth mucous membranes moist, pharynx normal without lesions and dental hygiene good   Neck supple and no masses   Cardiovascular: regular rate and rhythm   Respiratory:  chest clear, no wheezing, crepitations, rhonchi, normal symmetric air entry   Abdomen: soft, non-tender; bowel sounds normal; no masses,  no organomegaly   Urinary:       Assessment:    Pregnancy: [id number] Patient Active Problem List  Diagnoses  . ANEMIA-IRON DEFICIENCY  . TINNITUS, CHRONIC, RIGHT  . EXTERNAL HEMORRHOIDS  . ALLERGIC RHINITIS   . HYPOTHYROIDISM  . Supervision of normal pregnancy  . Uterine fibroids affecting pregnancy, antepartum  . Hypothyroidism in pregnancy, antepartum  . Subchorionic hemorrhage of placenta        Plan:     Initial labs drawn. Prenatal vitamins. Problem list reviewed and updated. Genetic Screening discussed First Screen and Quad Screen: declined.  Ultrasound discussed; fetal survey: requested.  Follow up in 4 weeks.      Alcides Nutting 01/23/2012

## 2012-01-23 NOTE — Patient Instructions (Signed)
Pregnancy - First Trimester  During sexual intercourse, millions of sperm go into the vagina. Only 1 sperm will penetrate and fertilize the female egg while it is in the Fallopian tube. One week later, the fertilized egg implants into the wall of the uterus. An embryo begins to develop into a baby. At 6 to 8 weeks, the eyes and face are formed and the heartbeat can be seen on ultrasound. At the end of 12 weeks (first trimester), all the baby's organs are formed. Now that you are pregnant, you will want to do everything you can to have a healthy baby. Two of the most important things are to get good prenatal care and follow your caregiver's instructions. Prenatal care is all the medical care you receive before the baby's birth. It is given to prevent, find, and treat problems during the pregnancy and childbirth.  PRENATAL EXAMS   During prenatal visits, your weight, blood pressure and urine are checked. This is done to make sure you are healthy and progressing normally during the pregnancy.   A pregnant woman should gain 25 to 35 pounds during the pregnancy. However, if you are over weight or underweight, your caregiver will advise you regarding your weight.   Your caregiver will ask and answer questions for you.   Blood work, cervical cultures, other necessary tests and a Pap test are done during your prenatal exams. These tests are done to check on your health and the probable health of your baby. Tests are strongly recommended and done for HIV with your permission. This is the virus that causes AIDS. These tests are done because medications can be given to help prevent your baby from being born with this infection should you have been infected without knowing it. Blood work is also used to find out your blood type, previous infections and follow your blood levels (hemoglobin).   Low hemoglobin (anemia) is common during pregnancy. Iron and vitamins are given to help prevent this. Later in the pregnancy, blood  tests for diabetes will be done along with any other tests if any problems develop. You may need tests to make sure you and the baby are doing well.   You may need other tests to make sure you and the baby are doing well.  CHANGES DURING THE FIRST TRIMESTER (THE FIRST 3 MONTHS OF PREGNANCY)  Your body goes through many changes during pregnancy. They vary from person to person. Talk to your caregiver about changes you notice and are concerned about. Changes can include:   Your menstrual period stops.   The egg and sperm carry the genes that determine what you look like. Genes from you and your partner are forming a baby. The female genes determine whether the baby is a boy or a girl.   Your body increases in girth and you may feel bloated.   Feeling sick to your stomach (nauseous) and throwing up (vomiting). If the vomiting is uncontrollable, call your caregiver.   Your breasts will begin to enlarge and become tender.   Your nipples may stick out more and become darker.   The need to urinate more. Painful urination may mean you have a bladder infection.   Tiring easily.   Loss of appetite.   Cravings for certain kinds of food.   At first, you may gain or lose a couple of pounds.   You may have changes in your emotions from day to day (excited to be pregnant or concerned something may go wrong with   the pregnancy and baby).   You may have more vivid and strange dreams.  HOME CARE INSTRUCTIONS    It is very important to avoid all smoking, alcohol and un-prescribed drugs during your pregnancy. These affect the formation and growth of the baby. Avoid chemicals while pregnant to ensure the delivery of a healthy infant.   Start your prenatal visits by the 12th week of pregnancy. They are usually scheduled monthly at first, then more often in the last 2 months before delivery. Keep your caregiver's appointments. Follow your caregiver's instructions regarding medication use, blood and lab tests, exercise, and  diet.   During pregnancy, you are providing food for you and your baby. Eat regular, well-balanced meals. Choose foods such as meat, fish, milk and other low fat dairy products, vegetables, fruits, and whole-grain breads and cereals. Your caregiver will tell you of the ideal weight gain.   You can help morning sickness by keeping soda crackers at the bedside. Eat a couple before arising in the morning. You may want to use the crackers without salt on them.   Eating 4 to 5 small meals rather than 3 large meals a day also may help the nausea and vomiting.   Drinking liquids between meals instead of during meals also seems to help nausea and vomiting.   A physical sexual relationship may be continued throughout pregnancy if there are no other problems. Problems may be early (premature) leaking of amniotic fluid from the membranes, vaginal bleeding, or belly (abdominal) pain.   Exercise regularly if there are no restrictions. Check with your caregiver or physical therapist if you are unsure of the safety of some of your exercises. Greater weight gain will occur in the last 2 trimesters of pregnancy. Exercising will help:   Control your weight.   Keep you in shape.   Prepare you for labor and delivery.   Help you lose your pregnancy weight after you deliver your baby.   Wear a good support or jogging bra for breast tenderness during pregnancy. This may help if worn during sleep too.   Ask when prenatal classes are available. Begin classes when they are offered.   Do not use hot tubs, steam rooms or saunas.   Wear your seat belt when driving. This protects you and your baby if you are in an accident.   Avoid raw meat, uncooked cheese, cat litter boxes and soil used by cats throughout the pregnancy. These carry germs that can cause birth defects in the baby.   The first trimester is a good time to visit your dentist for your dental health. Getting your teeth cleaned is OK. Use a softer toothbrush and brush  gently during pregnancy.   Ask for help if you have financial, counseling or nutritional needs during pregnancy. Your caregiver will be able to offer counseling for these needs as well as refer you for other special needs.   Do not take any medications or herbs unless told by your caregiver.   Inform your caregiver if there is any mental or physical domestic violence.   Make a list of emergency phone numbers of family, friends, hospital, and police and fire departments.   Write down your questions. Take them to your prenatal visit.   Do not douche.   Do not cross your legs.   If you have to stand for long periods of time, rotate you feet or take small steps in a circle.   You may have more vaginal secretions that may   tampons or scented sanitary pads.  MEDICATIONS AND DRUG USE IN PREGNANCY  Take prenatal vitamins as directed. The vitamin should contain 1 milligram of folic acid. Keep all vitamins out of reach of children. Only a couple vitamins or tablets containing iron may be fatal to a baby or young child when ingested.   Avoid use of all medications, including herbs, over-the-counter medications, not prescribed or suggested by your caregiver. Only take over-the-counter or prescription medicines for pain, discomfort, or fever as directed by your caregiver. Do not use aspirin, ibuprofen, or naproxen unless directed by your caregiver.   Let your caregiver also know about herbs you may be using.   Alcohol is related to a number of birth defects. This includes fetal alcohol syndrome. All alcohol, in any form, should be avoided completely. Smoking will cause low birth rate and premature babies.   Street or illegal drugs are very harmful to the baby. They are absolutely forbidden. A baby born to an addicted mother will be addicted at birth. The baby will go through the same withdrawal an adult does.   Let your  caregiver know about any medications that you have to take and for what reason you take them.  MISCARRIAGE IS COMMON DURING PREGNANCY A miscarriage does not mean you did something wrong. It is not a reason to worry about getting pregnant again. Your caregiver will help you with questions you may have. If you have a miscarriage, you may need minor surgery. SEEK MEDICAL CARE IF:  You have any concerns or worries during your pregnancy. It is better to call with your questions if you feel they cannot wait, rather than worry about them. SEEK IMMEDIATE MEDICAL CARE IF:   An unexplained oral temperature above 100.4 F (38 C) develops, or as your caregiver suggests.   You have leaking of fluid from the vagina (birth canal). If leaking membranes are suspected, take your temperature and inform your caregiver of this when you call.   There is vaginal spotting or bleeding. Notify your caregiver of the amount and how many pads are used.   You develop a bad smelling vaginal discharge with a change in the color.   You continue to feel sick to your stomach (nauseated) and have no relief from remedies suggested. You vomit blood or coffee ground-like materials.   You lose more than 2 pounds of weight in 1 week.   You gain more than 2 pounds of weight in 1 week and you notice swelling of your face, hands, feet, or legs.   You gain 5 pounds or more in 1 week (even if you do not have swelling of your hands, face, legs, or feet).   You get exposed to [place] measles and have never had them.   You are exposed to fifth disease or chickenpox.   You develop belly (abdominal) pain. Round ligament discomfort is a common non-cancerous (benign) cause of abdominal pain in pregnancy. Your caregiver still must evaluate this.   You develop headache, fever, diarrhea, pain with urination, or shortness of breath.   You fall or are in a car accident or have any kind of trauma.   There is mental or physical violence in  your home.  Document Released: 10/31/2001 Document Revised: 10/26/2011 Document Reviewed: 05/04/2009 [facility] Patient Information 2012 [place], [place].

## 2012-01-24 LAB — TSH: TSH: 5.912 u[IU]/mL — ABNORMAL HIGH (ref 0.350–4.500)

## 2012-02-18 ENCOUNTER — Telehealth: Payer: Self-pay | Admitting: Family Medicine

## 2012-02-18 DIAGNOSIS — E039 Hypothyroidism, unspecified: Secondary | ICD-10-CM

## 2012-02-18 MED ORDER — LEVOTHYROXINE SODIUM 75 MCG PO TABS: 75.0000 ug | ORAL_TABLET | Freq: Every day | ORAL | Status: DC

## 2012-02-18 NOTE — Telephone Encounter (Signed)
Message copied by Excell Seltzer on Sun Feb 18, 2012  9:25 PM ------      Message from: Barbara Cower      Created: Wed Feb 14, 2012  3:31 PM      Regarding: Medication dosage change       Please see patients TSH result.  Dr. Jolayne Panther would like for you to adjust her dose due to the abnormality.  Thank you.

## 2012-02-18 NOTE — Telephone Encounter (Signed)
TSh level 5.9.Marland Kitchen.have her increase levo to 75 mcg and recheck TSH in 4-6 weeks. Ordered med and lab.. Have her cancel 4/7 lab check if scheduled.

## 2012-02-20 ENCOUNTER — Encounter: Payer: BC Managed Care – PPO | Admitting: Obstetrics & Gynecology

## 2012-02-20 ENCOUNTER — Telehealth: Payer: Self-pay | Admitting: Gynecology

## 2012-02-20 ENCOUNTER — Ambulatory Visit (INDEPENDENT_AMBULATORY_CARE_PROVIDER_SITE_OTHER): Payer: BC Managed Care – PPO | Admitting: Obstetrics & Gynecology

## 2012-02-20 DIAGNOSIS — Z3480 Encounter for supervision of other normal pregnancy, unspecified trimester: Secondary | ICD-10-CM

## 2012-02-20 NOTE — Telephone Encounter (Signed)
Call patient. Message was left on patient voicemail to contact her pcp regarding her dosage of her synthroid.

## 2012-02-20 NOTE — Patient Instructions (Signed)

## 2012-02-20 NOTE — Telephone Encounter (Signed)
Patient advised and appt made of lab work

## 2012-02-20 NOTE — Telephone Encounter (Signed)
Message copied by Marylyn Ishihara on Tue Feb 20, 2012  9:36 AM ------      Message from: CONSTANT, Gigi Gin      Created: Tue Feb 13, 2012 11:46 AM       Please contact patient to be seen by Kerby Nora to further adjust dosage of synthroid            Peggy

## 2012-02-20 NOTE — Progress Notes (Signed)
Elevated TSH, should start new Synthroid dose 75 mcg. US shows viable fetus, movement and nl FHM.    Declines prenatal screening, will schedule 18-19 week [place] at next visit

## 2012-02-26 ENCOUNTER — Other Ambulatory Visit: Payer: Self-pay

## 2012-02-26 NOTE — Telephone Encounter (Signed)
Pt said [facility] [address] does not have Levothyroxine 75 mcg. I called spoke with Dawn at [facility] [address] and rx is ready for pick up. Patient notified as instructed by telephone that rx ready for pick up.

## 2012-03-18 ENCOUNTER — Other Ambulatory Visit: Payer: Self-pay | Admitting: Family Medicine

## 2012-03-18 ENCOUNTER — Encounter: Payer: BC Managed Care – PPO | Admitting: Obstetrics & Gynecology

## 2012-03-19 ENCOUNTER — Encounter: Payer: Self-pay | Admitting: Obstetrics and Gynecology

## 2012-03-19 ENCOUNTER — Ambulatory Visit (INDEPENDENT_AMBULATORY_CARE_PROVIDER_SITE_OTHER): Payer: BC Managed Care – PPO | Admitting: Obstetrics and Gynecology

## 2012-03-19 VITALS — BP 110/68 | Wt 139.0 lb

## 2012-03-19 DIAGNOSIS — E079 Disorder of thyroid, unspecified: Secondary | ICD-10-CM

## 2012-03-19 DIAGNOSIS — Z3480 Encounter for supervision of other normal pregnancy, unspecified trimester: Secondary | ICD-10-CM

## 2012-03-19 DIAGNOSIS — O3410 Maternal care for benign tumor of corpus uteri, unspecified trimester: Secondary | ICD-10-CM

## 2012-03-19 DIAGNOSIS — O418X90 Other specified disorders of amniotic fluid and membranes, unspecified trimester, not applicable or unspecified: Secondary | ICD-10-CM

## 2012-03-19 DIAGNOSIS — D259 Leiomyoma of uterus, unspecified: Secondary | ICD-10-CM

## 2012-03-19 DIAGNOSIS — O4590 Premature separation of placenta, unspecified, unspecified trimester: Secondary | ICD-10-CM

## 2012-03-19 DIAGNOSIS — E039 Hypothyroidism, unspecified: Secondary | ICD-10-CM

## 2012-03-19 DIAGNOSIS — Z3490 Encounter for supervision of normal pregnancy, unspecified, unspecified trimester: Secondary | ICD-10-CM

## 2012-03-19 NOTE — Progress Notes (Signed)
Patient doing well without complaints. Fetal movement and pos heart beat visualized on ultrasound. Patient has a significant uterine fibroid that made it difficult to hear the heart beat with dopplers. Will schedule anatomy US week of May 20. Patient complaining of constipation. Currently taking colace prn, advise to take colace daily

## 2012-03-19 NOTE — Progress Notes (Signed)
Occasional constipation, would like something to take to help with this.

## 2012-03-21 ENCOUNTER — Telehealth: Payer: Self-pay

## 2012-03-21 ENCOUNTER — Other Ambulatory Visit (INDEPENDENT_AMBULATORY_CARE_PROVIDER_SITE_OTHER): Payer: BC Managed Care – PPO

## 2012-03-21 DIAGNOSIS — E039 Hypothyroidism, unspecified: Secondary | ICD-10-CM

## 2012-03-21 LAB — TSH: TSH: 2.83 u[IU]/mL (ref 0.35–5.50)

## 2012-03-21 NOTE — Telephone Encounter (Signed)
Call pt ASAP. The thyroid medication cannot hurt baby... NOT taking it is what could cause a problem with babies cognitive (brain) development. Often in pregnancy the dose needs to increase by 30-50%. She needs to have her TSH checked immediately if not done by GYN already, then followed every 4 weeks after. Let pt know and set up lab appt if needed.

## 2012-03-21 NOTE — Telephone Encounter (Signed)
Pt had lab work today and has appt with Dr Ermalene Searing on 03/26/12 to discuss thyroid and pts pregnancy. Pt is about [redacted] weeks pregnant and wants to know prior to her appt if thyroid med could affect baby. Pt also wants levothyroxine 75 mcg transferred from [facility] [address] to [place]. Spoke with [employer] and they will get med transferred and rx ready for pick up.Pt can be reached at [phone].Please advise.

## 2012-03-22 NOTE — Telephone Encounter (Signed)
Patient advised and understands 

## 2012-03-26 ENCOUNTER — Encounter: Payer: Self-pay | Admitting: Family Medicine

## 2012-03-26 ENCOUNTER — Ambulatory Visit (INDEPENDENT_AMBULATORY_CARE_PROVIDER_SITE_OTHER): Payer: BC Managed Care – PPO | Admitting: Family Medicine

## 2012-03-26 DIAGNOSIS — O99280 Endocrine, nutritional and metabolic diseases complicating pregnancy, unspecified trimester: Secondary | ICD-10-CM

## 2012-03-26 NOTE — Patient Instructions (Addendum)
It is important to take thyroid medicaiton every day. Stop at front desk to schedule labs. I recommend checking TSH thyroid test every 4 weeks in pregnancy... We will schedule here but if you would like to do at [facility] that would be okay.

## 2012-03-26 NOTE — Progress Notes (Signed)
  Subjective:    Patient ID: [name], female    DOB: 1976/10/24, 36 y.o.   MRN: [id number]  HPI  [redacted] weeks pregnant... Here for hypothyroidism management. Feels well overall.   She had elevated TSH at 11 week at [facility].Marland Kitchen Reviewed recent notes in detail. Medication was increased to 75 mcg daily. Since then she was out of med for 4 days... Most recent below Lab Results  Component Value Date   TSH 2.83 03/21/2012      Review of Systems  Constitutional: Negative for fever and fatigue.  HENT: Negative for ear pain.   Eyes: Negative for pain.  Respiratory: Negative for chest tightness and shortness of breath.   Cardiovascular: Negative for chest pain, palpitations and leg swelling.  Gastrointestinal: Negative for abdominal pain.  Genitourinary: Negative for dysuria.       Objective:   Physical Exam  Constitutional: Vital signs are normal. She appears well-developed and well-nourished. She is cooperative.  Non-toxic appearance. She does not appear ill. No distress.  HENT:  Head: Normocephalic.  Right Ear: Hearing, tympanic membrane, external ear and ear canal normal. Tympanic membrane is not erythematous, not retracted and not bulging.  Left Ear: Hearing, tympanic membrane, external ear and ear canal normal. Tympanic membrane is not erythematous, not retracted and not bulging.  Nose: No mucosal edema or rhinorrhea. Right sinus exhibits no maxillary sinus tenderness and no frontal sinus tenderness. Left sinus exhibits no maxillary sinus tenderness and no frontal sinus tenderness.  Mouth/Throat: Uvula is midline, oropharynx is clear and moist and mucous membranes are normal.  Eyes: Conjunctivae, EOM and lids are normal. Pupils are equal, round, and reactive to light. No foreign bodies found.  Neck: Trachea normal and normal range of motion. Neck supple. Carotid bruit is not present. No mass and no thyromegaly present.  Cardiovascular: Normal rate, regular rhythm, S1 normal, S2 normal, normal  heart sounds, intact distal pulses and normal pulses.  Exam reveals no gallop and no friction rub.   No murmur heard. Pulmonary/Chest: Effort normal and breath sounds normal. Not tachypneic. No respiratory distress. She has no decreased breath sounds. She has no wheezes. She has no rhonchi. She has no rales.  Abdominal: Soft. Normal appearance and bowel sounds are normal. There is no tenderness.  Neurological: She is alert.  Skin: Skin is warm, dry and intact. No rash noted.  Psychiatric: Her speech is normal and behavior is normal. Judgment and thought content normal. Her mood appears not anxious. Cognition and memory are normal. She does not exhibit a depressed mood.          Assessment & Plan:

## 2012-04-09 ENCOUNTER — Ambulatory Visit (HOSPITAL_COMMUNITY)
Admission: RE | Admit: 2012-04-09 | Discharge: 2012-04-09 | Disposition: A | Discharge status: Home / Self Care | Payer: BC Managed Care – PPO | Source: Ambulatory Visit | Attending: Obstetrics and Gynecology | Admitting: Obstetrics and Gynecology

## 2012-04-09 DIAGNOSIS — O3410 Maternal care for benign tumor of corpus uteri, unspecified trimester: Secondary | ICD-10-CM | POA: Insufficient documentation

## 2012-04-09 DIAGNOSIS — E079 Disorder of thyroid, unspecified: Secondary | ICD-10-CM | POA: Insufficient documentation

## 2012-04-09 DIAGNOSIS — Z3490 Encounter for supervision of normal pregnancy, unspecified, unspecified trimester: Secondary | ICD-10-CM

## 2012-04-09 DIAGNOSIS — O358XX0 Maternal care for other (suspected) fetal abnormality and damage, not applicable or unspecified: Secondary | ICD-10-CM | POA: Insufficient documentation

## 2012-04-09 DIAGNOSIS — E039 Hypothyroidism, unspecified: Secondary | ICD-10-CM | POA: Insufficient documentation

## 2012-04-09 DIAGNOSIS — Z1389 Encounter for screening for other disorder: Secondary | ICD-10-CM | POA: Insufficient documentation

## 2012-04-09 DIAGNOSIS — Z363 Encounter for antenatal screening for malformations: Secondary | ICD-10-CM | POA: Insufficient documentation

## 2012-04-16 ENCOUNTER — Ambulatory Visit (INDEPENDENT_AMBULATORY_CARE_PROVIDER_SITE_OTHER): Payer: BC Managed Care – PPO | Admitting: Obstetrics & Gynecology

## 2012-04-16 ENCOUNTER — Encounter: Payer: Self-pay | Admitting: Obstetrics & Gynecology

## 2012-04-16 VITALS — BP 98/69 | Wt 142.0 lb

## 2012-04-16 DIAGNOSIS — E039 Hypothyroidism, unspecified: Secondary | ICD-10-CM

## 2012-04-16 DIAGNOSIS — E079 Disorder of thyroid, unspecified: Secondary | ICD-10-CM

## 2012-04-16 DIAGNOSIS — Z3490 Encounter for supervision of normal pregnancy, unspecified, unspecified trimester: Secondary | ICD-10-CM

## 2012-04-16 DIAGNOSIS — Z3480 Encounter for supervision of other normal pregnancy, unspecified trimester: Secondary | ICD-10-CM

## 2012-04-16 NOTE — Progress Notes (Signed)
Routine visit. Good FM. No problems. FP MD wants [place] to draw TSH today.

## 2012-04-17 LAB — TSH: TSH: 1.779 u[IU]/mL (ref 0.350–4.500)

## 2012-05-14 ENCOUNTER — Ambulatory Visit (INDEPENDENT_AMBULATORY_CARE_PROVIDER_SITE_OTHER): Payer: Self-pay | Admitting: Obstetrics & Gynecology

## 2012-05-14 ENCOUNTER — Encounter: Payer: Self-pay | Admitting: Obstetrics & Gynecology

## 2012-05-14 VITALS — BP 100/65 | Wt 144.0 lb

## 2012-05-14 DIAGNOSIS — IMO0002 Reserved for concepts with insufficient information to code with codable children: Secondary | ICD-10-CM

## 2012-05-14 DIAGNOSIS — Z3490 Encounter for supervision of normal pregnancy, unspecified, unspecified trimester: Secondary | ICD-10-CM

## 2012-05-14 DIAGNOSIS — Z3480 Encounter for supervision of other normal pregnancy, unspecified trimester: Secondary | ICD-10-CM

## 2012-05-14 NOTE — Progress Notes (Signed)
Routine visit. No problems except mild carpal tunnel symptoms and constipation. I recommended a wrist brace and OTC stool softeners.  Good FM. 1 hour glucola at [place]. Denies VB, ROM, CTXs.

## 2012-06-11 ENCOUNTER — Inpatient Hospital Stay (HOSPITAL_COMMUNITY)
Admission: AD | Admit: 2012-06-11 | Discharge: 2012-06-18 | DRG: 886 | Disposition: A | Discharge status: Home / Self Care | Payer: BC Managed Care – PPO | Source: Ambulatory Visit | Attending: Obstetrics & Gynecology | Admitting: Obstetrics & Gynecology

## 2012-06-11 ENCOUNTER — Encounter: Payer: Self-pay | Admitting: Obstetrics & Gynecology

## 2012-06-11 ENCOUNTER — Ambulatory Visit (INDEPENDENT_AMBULATORY_CARE_PROVIDER_SITE_OTHER): Payer: BC Managed Care – PPO | Admitting: Obstetrics & Gynecology

## 2012-06-11 ENCOUNTER — Inpatient Hospital Stay (HOSPITAL_COMMUNITY): Payer: BC Managed Care – PPO

## 2012-06-11 ENCOUNTER — Encounter (HOSPITAL_COMMUNITY): Payer: Self-pay

## 2012-06-11 VITALS — BP 118/74 | Wt 147.0 lb

## 2012-06-11 DIAGNOSIS — Z3490 Encounter for supervision of normal pregnancy, unspecified, unspecified trimester: Secondary | ICD-10-CM

## 2012-06-11 DIAGNOSIS — Z3480 Encounter for supervision of other normal pregnancy, unspecified trimester: Secondary | ICD-10-CM

## 2012-06-11 DIAGNOSIS — O468X9 Other antepartum hemorrhage, unspecified trimester: Principal | ICD-10-CM | POA: Diagnosis present

## 2012-06-11 DIAGNOSIS — O228X9 Other venous complications in pregnancy, unspecified trimester: Secondary | ICD-10-CM

## 2012-06-11 DIAGNOSIS — O4290 Premature rupture of membranes, unspecified as to length of time between rupture and onset of labor, unspecified weeks of gestation: Secondary | ICD-10-CM | POA: Diagnosis present

## 2012-06-11 DIAGNOSIS — E079 Disorder of thyroid, unspecified: Secondary | ICD-10-CM | POA: Diagnosis present

## 2012-06-11 DIAGNOSIS — O99019 Anemia complicating pregnancy, unspecified trimester: Secondary | ICD-10-CM | POA: Diagnosis present

## 2012-06-11 DIAGNOSIS — O0990 Supervision of high risk pregnancy, unspecified, unspecified trimester: Secondary | ICD-10-CM

## 2012-06-11 DIAGNOSIS — E039 Hypothyroidism, unspecified: Secondary | ICD-10-CM

## 2012-06-11 DIAGNOSIS — K649 Unspecified hemorrhoids: Secondary | ICD-10-CM

## 2012-06-11 DIAGNOSIS — O42913 Preterm premature rupture of membranes, unspecified as to length of time between rupture and onset of labor, third trimester: Secondary | ICD-10-CM | POA: Diagnosis present

## 2012-06-11 DIAGNOSIS — IMO0002 Reserved for concepts with insufficient information to code with codable children: Secondary | ICD-10-CM | POA: Diagnosis present

## 2012-06-11 DIAGNOSIS — O3410 Maternal care for benign tumor of corpus uteri, unspecified trimester: Secondary | ICD-10-CM

## 2012-06-11 DIAGNOSIS — O99280 Endocrine, nutritional and metabolic diseases complicating pregnancy, unspecified trimester: Secondary | ICD-10-CM

## 2012-06-11 DIAGNOSIS — O42919 Preterm premature rupture of membranes, unspecified as to length of time between rupture and onset of labor, unspecified trimester: Secondary | ICD-10-CM

## 2012-06-11 DIAGNOSIS — D509 Iron deficiency anemia, unspecified: Secondary | ICD-10-CM | POA: Diagnosis present

## 2012-06-11 LAB — CBC
HCT: 35.7 % — ABNORMAL LOW (ref 36.0–46.0)
Hemoglobin: 12 g/dL (ref 12.0–15.0)
Hemoglobin: 12.1 g/dL (ref 12.0–15.0)
MCH: 31.5 pg (ref 26.0–34.0)
MCHC: 33.6 g/dL (ref 30.0–36.0)
Platelets: 169 10*3/uL (ref 150–400)
RBC: 3.86 MIL/uL — ABNORMAL LOW (ref 3.87–5.11)
RBC: 3.84 MIL/uL — ABNORMAL LOW (ref 3.87–5.11)
WBC: 7.2 10*3/uL (ref 4.0–10.5)
WBC: 7.5 10*3/uL (ref 4.0–10.5)

## 2012-06-11 LAB — TYPE AND SCREEN: Antibody Screen: NEGATIVE

## 2012-06-11 LAB — WET PREP, GENITAL: Yeast Wet Prep HPF POC: NONE SEEN

## 2012-06-11 MED ORDER — PRENATAL MULTIVITAMIN CH
1.0000 | ORAL_TABLET | Freq: Every day | ORAL | Status: DC
  Administered 2012-06-11 – 2012-06-18 (×8): 1 via ORAL
  Filled 2012-06-11 (×10): qty 1

## 2012-06-11 MED ORDER — DOCUSATE SODIUM 100 MG PO CAPS
100.0000 mg | ORAL_CAPSULE | Freq: Every day | ORAL | Status: DC
  Administered 2012-06-11 – 2012-06-18 (×8): 100 mg via ORAL
  Filled 2012-06-11 (×10): qty 1

## 2012-06-11 MED ORDER — ERYTHROMYCIN BASE 250 MG PO TABS
250.0000 mg | ORAL_TABLET | Freq: Four times a day (QID) | ORAL | Status: DC
  Administered 2012-06-13 – 2012-06-18 (×19): 250 mg via ORAL
  Filled 2012-06-11 (×20): qty 1

## 2012-06-11 MED ORDER — ZOLPIDEM TARTRATE 5 MG PO TABS: 5.0000 mg | ORAL_TABLET | Freq: Every evening | ORAL | Status: DC | PRN

## 2012-06-11 MED ORDER — BETAMETHASONE SOD PHOS & ACET 6 (3-3) MG/ML IJ SUSP
12.0000 mg | INTRAMUSCULAR | Status: AC
  Administered 2012-06-11 – 2012-06-12 (×2): 12 mg via INTRAMUSCULAR
  Filled 2012-06-11 (×2): qty 2

## 2012-06-11 MED ORDER — AMOXICILLIN 500 MG PO CAPS
500.0000 mg | ORAL_CAPSULE | Freq: Three times a day (TID) | ORAL | Status: DC
  Administered 2012-06-13 – 2012-06-18 (×14): 500 mg via ORAL
  Filled 2012-06-11 (×15): qty 1

## 2012-06-11 MED ORDER — SODIUM CHLORIDE 0.9 % IV SOLN
250.0000 mg | Freq: Four times a day (QID) | INTRAVENOUS | Status: AC
  Administered 2012-06-11 – 2012-06-13 (×8): 250 mg via INTRAVENOUS
  Filled 2012-06-11 (×8): qty 250

## 2012-06-11 MED ORDER — LACTATED RINGERS IV SOLN
INTRAVENOUS | Status: DC
  Administered 2012-06-11 – 2012-06-12 (×3): via INTRAVENOUS
  Administered 2012-06-12: 100 mL/h via INTRAVENOUS
  Administered 2012-06-13 – 2012-06-14 (×3): via INTRAVENOUS

## 2012-06-11 MED ORDER — HYDROCORTISONE ACETATE 25 MG RE SUPP
25.0000 mg | Freq: Two times a day (BID) | RECTAL | Status: DC
  Administered 2012-06-12 – 2012-06-18 (×13): 25 mg via RECTAL
  Filled 2012-06-11 (×16): qty 1

## 2012-06-11 MED ORDER — CALCIUM CARBONATE ANTACID 500 MG PO CHEW
2.0000 | CHEWABLE_TABLET | ORAL | Status: DC | PRN
  Filled 2012-06-11: qty 2

## 2012-06-11 MED ORDER — LEVOTHYROXINE SODIUM 75 MCG PO TABS
75.0000 ug | ORAL_TABLET | Freq: Every day | ORAL | Status: DC
  Administered 2012-06-12 – 2012-06-18 (×8): 75 ug via ORAL
  Filled 2012-06-11 (×9): qty 1

## 2012-06-11 MED ORDER — SODIUM CHLORIDE 0.9 % IV SOLN
2.0000 g | Freq: Four times a day (QID) | INTRAVENOUS | Status: AC
  Administered 2012-06-11 – 2012-06-13 (×8): 2 g via INTRAVENOUS
  Filled 2012-06-11 (×8): qty 2000

## 2012-06-11 MED ORDER — ACETAMINOPHEN 325 MG PO TABS: 650.0000 mg | ORAL_TABLET | ORAL | Status: DC | PRN

## 2012-06-11 MED ORDER — LEVOTHYROXINE SODIUM 75 MCG PO TABS
75.0000 ug | ORAL_TABLET | Freq: Every day | ORAL | Status: DC
  Filled 2012-06-11 (×2): qty 1

## 2012-06-11 MED ORDER — HYDROCORTISONE ACETATE 25 MG RE SUPP: 25.0000 mg | Freq: Two times a day (BID) | RECTAL | Status: AC

## 2012-06-11 NOTE — MAU Note (Signed)
Per Dr. Adrian Blackwater, pt may eat, and monitors may be d/c'd

## 2012-06-11 NOTE — Progress Notes (Signed)
Routine visit, glucola today at 1:45.  Complains of hemrhoids that are bleeding.  Otherwise doing well.

## 2012-06-11 NOTE — MAU Note (Signed)
Pt states around 1600 noted underwear to be saturated, is unsure if this is amniotic fluid. Had Dr. appt this afternoon as well. Had PROM with first pregnancy.

## 2012-06-11 NOTE — MAU Provider Note (Signed)
History     CSN: [id number]  Arrival date and time: 06/11/12 1658   None     Chief Complaint  Patient presents with  . R/O Preterm ROM    HPI This is a 36 year old G3 P1 011 at 27 weeks and 5 days who presents the MAU with concerns of ruptured membranes. The patient was eating dinner at 4:30 instead up and felt fluid running her leg. She came to the MAU for evaluation. She denies contractions, vaginal discharge, decreased fetal activity.  OB History    Grav Para Term Preterm Abortions TAB SAB Ect Mult Living   3 1 1  0 1 0 1 0 0 1      Past Medical History  Diagnosis Date  . Preterm labor     Past Surgical History  Procedure Date  . Hemrrhoidectomy     age 21 or 68    Family History  Problem Relation Age of Onset  . Stroke Father   . Other Neg Hx     History  Substance Use Topics  . Smoking status: Never Smoker   . Smokeless tobacco: Never Used  . Alcohol Use: No    Allergies: No Known Allergies  Prescriptions prior to admission  Medication Sig Dispense Refill  . Ascorbic Acid (VITAMIN C) 100 MG tablet Take 100 mg by mouth daily.      Marland Kitchen levothyroxine (SYNTHROID, LEVOTHROID) 75 MCG tablet Take 1 tablet (75 mcg total) by mouth daily.  30 tablet  11  . Prenatal Vit-Fe Fumarate-FA (MULTIVITAMIN-PRENATAL) 27-0.8 MG TABS Take 1 tablet by mouth daily.      . hydrocortisone (ANUSOL-HC) 25 MG suppository Place 1 suppository (25 mg total) rectally 2 (two) times daily.  12 suppository  1    ROS Physical Exam   Blood pressure 109/66, pulse 80, temperature 98.1 F (36.7 C), temperature source Oral, resp. rate 16, weight 66.679 kg (147 lb), last menstrual period 11/24/2011, SpO2 98.00%.  Physical Exam  Constitutional: She is oriented to person, place, and time. She appears well-developed and well-nourished.  HENT:  Head: Normocephalic and atraumatic.  GI: Soft. She exhibits no distension and no mass. There is no tenderness. There is no rebound and no guarding.    Genitourinary:       Normal external genitalia. Pooling of thin watery white discharge. Normal-appearing cervix.  Neurological: She is alert and oriented to person, place, and time.  Skin: Skin is warm and dry.  Psychiatric: She has a normal mood and affect. Her behavior is normal. Judgment and thought content normal.   Fern: positive  Results for orders placed during the hospital encounter of 06/11/12 (from the past 24 hour(s))  WET PREP, GENITAL     Status: Abnormal   Collection Time   06/11/12  6:23 PM      Component Value Range   Yeast Wet Prep HPF POC NONE SEEN  NONE SEEN   Trich, Wet Prep NONE SEEN  NONE SEEN   Clue Cells Wet Prep HPF POC NONE SEEN  NONE SEEN   WBC, Wet Prep HPF POC MANY (*) NONE SEEN     MAU Course  Procedures NST: Category 1 tracing at 140s. Appropriate for gestational age.  MDM PPROM by ferning and pooling.  Assessment and Plan  1.  [id number] with IUP at 27.5 weeks 2.  PPROM 3.  Hypothyroidism 4.  Subchorionic hemorrhage of placenta  Admit to antenatal.  Latency antibiotics: ampicillin/amoxicillin and erythromycin.  Betamethasone x 2.  [place] to  confirm fetal position.  GC/chlamydia collected.  Candelaria Celeste JEHIEL 06/11/2012, 6:38 PM

## 2012-06-11 NOTE — Progress Notes (Signed)
Prescribed Anusol-HC for the hemorrhoids, will continue to evaluate.  1 hr GTT, TSH, third trimester labs, TDap vaccine today.  No other complaints or concerns.  Fetal movement and labor precautions reviewed.  Plan to check growth scan around 32 weeks or earlier if indicated given marginal cord insertion.

## 2012-06-11 NOTE — H&P (Signed)
History     CSN: [id number]  Arrival date and time: 06/11/12 1658   None     Chief Complaint  Patient presents with  . R/O Preterm ROM    HPI This is a 36-year-old G3 P1 011 at 27 weeks and 5 days who presents the MAU with concerns of ruptured membranes. The patient was eating dinner at 4:30 instead up and felt fluid running her leg. She came to the MAU for evaluation. She denies contractions, vaginal discharge, decreased fetal activity.  OB History    Grav Para Term Preterm Abortions TAB SAB Ect Mult Living   3 1 1 0 1 0 1 0 0 1      Past Medical History  Diagnosis Date  . Preterm labor     Past Surgical History  Procedure Date  . Hemrrhoidectomy     age 21 or 22    Family History  Problem Relation Age of Onset  . Stroke Father   . Other Neg Hx     History  Substance Use Topics  . Smoking status: Never Smoker   . Smokeless tobacco: Never Used  . Alcohol Use: No    Allergies: No Known Allergies  Prescriptions prior to admission  Medication Sig Dispense Refill  . Ascorbic Acid (VITAMIN C) 100 MG tablet Take 100 mg by mouth daily.      . levothyroxine (SYNTHROID, LEVOTHROID) 75 MCG tablet Take 1 tablet (75 mcg total) by mouth daily.  30 tablet  11  . Prenatal Vit-Fe Fumarate-FA (MULTIVITAMIN-PRENATAL) 27-0.8 MG TABS Take 1 tablet by mouth daily.      . hydrocortisone (ANUSOL-HC) 25 MG suppository Place 1 suppository (25 mg total) rectally 2 (two) times daily.  12 suppository  1    ROS Physical Exam   Blood pressure 109/66, pulse 80, temperature 98.1 F (36.7 C), temperature source Oral, resp. rate 16, weight 66.679 kg (147 lb), last menstrual period 11/24/2011, SpO2 98.00%.  Physical Exam  Constitutional: She is oriented to person, place, and time. She appears well-developed and well-nourished.  HENT:  Head: Normocephalic and atraumatic.  GI: Soft. She exhibits no distension and no mass. There is no tenderness. There is no rebound and no guarding.    Genitourinary:       Normal external genitalia. Pooling of thin watery white discharge. Normal-appearing cervix.  Neurological: She is alert and oriented to person, place, and time.  Skin: Skin is warm and dry.  Psychiatric: She has a normal mood and affect. Her behavior is normal. Judgment and thought content normal.   Fern: positive  Results for orders placed during the hospital encounter of 06/11/12 (from the past 24 hour(s))  WET PREP, GENITAL     Status: Abnormal   Collection Time   06/11/12  6:23 PM      Component Value Range   Yeast Wet Prep HPF POC NONE SEEN  NONE SEEN   Trich, Wet Prep NONE SEEN  NONE SEEN   Clue Cells Wet Prep HPF POC NONE SEEN  NONE SEEN   WBC, Wet Prep HPF POC MANY (*) NONE SEEN     MAU Course  Procedures NST: Category 1 tracing at 140s. Appropriate for gestational age.  MDM PPROM by ferning and pooling.  Assessment and Plan  1.  G3P1011 with IUP at 27.5 weeks 2.  PPROM 3.  Hypothyroidism 4.  Subchorionic hemorrhage of placenta  Admit to antenatal.  Latency antibiotics: ampicillin/amoxicillin and erythromycin.  Betamethasone x 2.  US to   confirm fetal position.  GC/chlamydia collected.  Korri Ask JEHIEL 06/11/2012, 6:38 PM  

## 2012-06-11 NOTE — MAU Note (Signed)
Dr. Adrian Blackwater notified amnisure not collected/sent, additional fern slide obtained, upon review fern slide is positive.

## 2012-06-11 NOTE — Patient Instructions (Signed)

## 2012-06-12 ENCOUNTER — Encounter (HOSPITAL_COMMUNITY): Payer: Self-pay | Admitting: *Deleted

## 2012-06-12 ENCOUNTER — Encounter: Payer: Self-pay | Admitting: Obstetrics & Gynecology

## 2012-06-12 DIAGNOSIS — O42913 Preterm premature rupture of membranes, unspecified as to length of time between rupture and onset of labor, third trimester: Secondary | ICD-10-CM | POA: Insufficient documentation

## 2012-06-12 LAB — TSH: TSH: 2.066 u[IU]/mL (ref 0.350–4.500)

## 2012-06-12 LAB — GLUCOSE TOLERANCE, 1 HOUR (50G) W/O FASTING: Glucose, 1 Hour GTT: 87 mg/dL (ref 70–140)

## 2012-06-12 LAB — HIV ANTIBODY (ROUTINE TESTING W REFLEX): HIV: NONREACTIVE

## 2012-06-12 LAB — RPR: RPR Ser Ql: NONREACTIVE

## 2012-06-12 LAB — GC/CHLAMYDIA PROBE AMP, GENITAL: GC Probe Amp, Genital: NEGATIVE

## 2012-06-12 MED ORDER — MAGNESIUM SULFATE 40 G IN LACTATED RINGERS - SIMPLE
2.0000 g/h | INTRAVENOUS | Status: AC
  Administered 2012-06-13: 2 g/h via INTRAVENOUS
  Filled 2012-06-12 (×2): qty 500

## 2012-06-12 MED ORDER — MAGNESIUM SULFATE BOLUS VIA INFUSION
4.0000 g | Freq: Once | INTRAVENOUS | Status: AC
  Administered 2012-06-12: 4 g via INTRAVENOUS
  Filled 2012-06-12: qty 500

## 2012-06-12 NOTE — Progress Notes (Signed)
S. Denies abdominal pain or CTXs. No vaginal bleeding, but continued leaking fluid.  O. VSS, AF     FHR- reassuring, category 1     CTX- occasional uterine irritability     ABD- NT on exam  A/P. 27.[redacted] weeks EGA with PPROM. I did start mag last night in order to gain time to get steroids in. No signs/sxs of chorio at present.

## 2012-06-12 NOTE — Progress Notes (Signed)
UR chart review completed.  

## 2012-06-13 DIAGNOSIS — O99280 Endocrine, nutritional and metabolic diseases complicating pregnancy, unspecified trimester: Secondary | ICD-10-CM

## 2012-06-13 DIAGNOSIS — E039 Hypothyroidism, unspecified: Secondary | ICD-10-CM

## 2012-06-13 DIAGNOSIS — O228X9 Other venous complications in pregnancy, unspecified trimester: Secondary | ICD-10-CM

## 2012-06-13 DIAGNOSIS — O4290 Premature rupture of membranes, unspecified as to length of time between rupture and onset of labor, unspecified weeks of gestation: Secondary | ICD-10-CM

## 2012-06-13 DIAGNOSIS — E079 Disorder of thyroid, unspecified: Secondary | ICD-10-CM

## 2012-06-13 LAB — RPR

## 2012-06-13 NOTE — Progress Notes (Signed)
Patient ID: [name], female   DOB: [birth year], 36 y.o.   MRN: [id number] S. Denies abdominal pain or CTXs. No vaginal bleeding, no fluid leakage now  O. Blood pressure 100/50, pulse 85, temperature 98.5 F (36.9 C), temperature source Oral, resp. rate 18, height 5\' 6"  (1.676 m), weight 66.679 kg (147 lb), last menstrual period 11/24/2011, SpO2 97.00%.       FHR- reassuring, category 1     CTX- occasional uterine irritability     ABD- NT on exam  Recent Results (from the past 48 hour(s))  TSH   Collection Time   06/11/12  4:24 PM      Component Value Range   TSH 2.066  0.350 - 4.500 uIU/mL  CBC   Collection Time   06/11/12  4:24 PM      Component Value Range   WBC 7.2  4.0 - 10.5 K/uL   RBC 3.86 (*) 3.87 - 5.11 MIL/uL   Hemoglobin 12.0  12.0 - 15.0 g/dL   HCT 40.9 (*) 81.1 - 91.4 %   MCV 92.5  78.0 - 100.0 fL   MCH 31.1  26.0 - 34.0 pg   MCHC 33.6  30.0 - 36.0 g/dL   RDW 78.2  95.6 - 21.3 %   Platelets 184  150 - 400 K/uL  RPR   Collection Time   06/11/12  4:24 PM      Component Value Range   RPR NON REAC  NON REAC  HIV ANTIBODY (ROUTINE TESTING)   Collection Time   06/11/12  4:24 PM      Component Value Range   HIV NON REACTIVE  NON REACTIVE  GLUCOSE TOLERANCE, 1 HOUR (50G) W/O FASTING   Collection Time   06/11/12  4:24 PM      Component Value Range   Glucose, 1 Hour GTT 87  70 - 140 mg/dL  WET PREP, GENITAL   Collection Time   06/11/12  6:23 PM      Component Value Range   Yeast Wet Prep HPF POC NONE SEEN  NONE SEEN   Trich, Wet Prep NONE SEEN  NONE SEEN   Clue Cells Wet Prep HPF POC NONE SEEN  NONE SEEN   WBC, Wet Prep HPF POC MANY (*) NONE SEEN  GC/CHLAMYDIA PROBE AMP, GENITAL   Collection Time   06/11/12  6:23 PM      Component Value Range   GC Probe Amp, Genital NEGATIVE  NEGATIVE   Chlamydia, DNA Probe NEGATIVE  NEGATIVE  CBC   Collection Time   06/11/12 10:15 PM      Component Value Range   WBC 7.5  4.0 - 10.5 K/uL   RBC 3.84 (*) 3.87 - 5.11 MIL/uL   Hemoglobin 12.1  12.0 - 15.0 g/dL   HCT 08.6  57.8 - 46.9 %   MCV 95.3  78.0 - 100.0 fL   MCH 31.5  26.0 - 34.0 pg   MCHC 33.1  30.0 - 36.0 g/dL   RDW 62.9  52.8 - 41.3 %   Platelets 169  150 - 400 K/uL  RPR   Collection Time   06/11/12 10:15 PM      Component Value Range   RPR NON REACTIVE  NON REACTIVE  TYPE AND SCREEN   Collection Time   06/11/12 10:15 PM      Component Value Range   ABO/RH(D) O POS     Antibody Screen NEG     Sample Expiration 06/14/2012  A/P. [redacted] weeks EGA with PPROM. Second dose of steroids given at 2000 last night, stop MgSO4 then tonight at that time.

## 2012-06-13 NOTE — Consult Note (Signed)
Neonatology Consult Note: I met with Mrs. Mcphearson and her husband.  Mrs Connelley is presently at 28 weeks with PPROM.  Her mag was discontinued last night (7/24) and she received a second dose of BMZ last night.      We discussed morbidity/mortality at this gestional age (national survival 92%), delivery room resuscitation, including intubation and surfactant in DR.  Discussed mechanical ventilation and risk for chronic lung disease, risk for IVH with potential for motor / cognitive deficits, ROP, NEC, sepsis, as well as temperature instability and feeding immaturity.  Discussed NG / OG feeds, benefits of MBM in reducing incidence of NEC.   Discussed likely length of stay. Thank you for allowing [place] to participate in her care.  Please call with questions.  John Giovanni, DO  Neonatologist

## 2012-06-14 DIAGNOSIS — O0990 Supervision of high risk pregnancy, unspecified, unspecified trimester: Secondary | ICD-10-CM

## 2012-06-14 NOTE — Progress Notes (Signed)
Patient ID: [name], female   DOB: [birth year], 36 y.o.   MRN: [id number] S. Denies abdominal pain or CTXs. No vaginal bleeding, no fluid leakage now  O. Blood pressure 110/69, pulse 83, temperature 97.9 F (36.6 C), temperature source Oral, resp. rate 18, height 5\' 6"  (1.676 m), weight 66.679 kg (147 lb), last menstrual period 11/24/2011, SpO2 97.00%.     FHR- reassuring, category 1     CTX- occasional uterine irritability     ABD- NT on exam  No results found for this or any previous visit (from the past 48 hour(s)).  A/P: [redacted]w[redacted]d with PPROM. S/P Betamethasone and Magnesium.  Continue latency antibiotics. No signs/symptoms of chorioamnionitis. Contine routine antenatal care.

## 2012-06-15 MED ORDER — SODIUM CHLORIDE 0.9 % IJ SOLN
3.0000 mL | Freq: Two times a day (BID) | INTRAMUSCULAR | Status: DC
  Administered 2012-06-15 – 2012-06-17 (×5): 3 mL via INTRAVENOUS

## 2012-06-15 NOTE — Progress Notes (Signed)
Patient ID: [name], female   DOB: 12-Nov-1976, 36 y.o.   MRN: [id number] FACULTY PRACTICE ANTEPARTUM(COMPREHENSIVE) NOTE  [name] is a 36 y.o. G3P1011 at [redacted]w[redacted]d by best clinical estimate who is admitted for PROM.   Fetal presentation is cephalic. Length of Stay:  4  Days  Subjective: No leakage Patient reports the fetal movement as active. Patient reports uterine contraction  activity as none. Patient reports  vaginal bleeding as none. Patient describes fluid per vagina as None.  Vitals:  Blood pressure 101/52, pulse 68, temperature 98.3 F (36.8 C), temperature source Oral, resp. rate 19, height 5\' 6"  (1.676 m), weight 66.679 kg (147 lb), last menstrual period 11/24/2011, SpO2 97.00%. Physical Examination:  General appearance - alert, well appearing, and in no distress Heart - normal rate and regular rhythm Abdomen - soft, nontender, nondistended Fundal Height:  size equals dates Cervical Exam: Not evaluated. and found to be not evaluated/ / and fetal presentation is cephalic. Extremities: extremities normal, atraumatic, no cyanosis or edema and Homans sign is negative, no sign of DVT with DTRs 2+ bilaterally Membranes: ruptured  Fetal Monitoring:  Baseline: 150 bpm  Labs:  No results found for this or any previous visit (from the past 24 hour(s)).  Imaging Studies:     Currently EPIC will not allow sonographic studies to automatically populate into notes.  In the meantime, copy and paste results into note or free text.  Medications:  Scheduled    . amoxicillin  500 mg Oral Q8H  . docusate sodium  100 mg Oral Daily  . erythromycin  250 mg Oral Q6H  . hydrocortisone  25 mg Rectal BID  . levothyroxine  75 mcg Oral QAC breakfast  . prenatal multivitamin  1 tablet Oral Daily   I have reviewed the patient's current medications.  ASSESSMENT: Patient Active Problem List  Diagnosis  . ANEMIA-IRON DEFICIENCY  . TINNITUS, CHRONIC, RIGHT  . EXTERNAL HEMORRHOIDS  . ALLERGIC  RHINITIS  . HYPOTHYROIDISM  . Supervision of high-risk pregnancy  . Uterine fibroids affecting pregnancy, antepartum  . Maternal hypothyroidism, antepartum  . Subchorionic hemorrhage of placenta  . Marginal insertion of umbilical cord  . Hemorrhoids complicating pregnancy or puerperium with antenatal complication  . Preterm premature rupture of membranes in third trimester    PLAN: Expectant management  Lenox Bink 06/15/2012,8:03 AM

## 2012-06-16 DIAGNOSIS — O4290 Premature rupture of membranes, unspecified as to length of time between rupture and onset of labor, unspecified weeks of gestation: Secondary | ICD-10-CM

## 2012-06-16 DIAGNOSIS — Z3480 Encounter for supervision of other normal pregnancy, unspecified trimester: Secondary | ICD-10-CM

## 2012-06-16 NOTE — Progress Notes (Signed)
Patient ID: [name], female   DOB: Nov 06, 1976, 36 y.o.   MRN: [id number] FACULTY PRACTICE ANTEPARTUM(COMPREHENSIVE) NOTE  [name] is a 36 y.o. G3P1011 at [redacted]w[redacted]d by best clinical estimate who is admitted for PPROM.   Fetal presentation is cephalic. Length of Stay:  5  Days  Subjective: No leakage Patient reports the fetal movement as active. Patient reports uterine contraction  activity as none. Patient reports  vaginal bleeding as none. Patient describes fluid per vagina as None.  Vitals:  Blood pressure 100/53, pulse 74, temperature 98 F (36.7 C), temperature source Oral, resp. rate 19, height 5\' 6"  (1.676 m), weight 66.679 kg (147 lb), last menstrual period 11/24/2011, SpO2 97.00%. Physical Examination:  General appearance - alert, well appearing, and in no distress Heart - normal rate and regular rhythm Abdomen - soft, nontender, nondistended Fundal Height:  size equals dates Cervical Exam: Not evaluated. and found to be not evaluated/ / and fetal presentation is cephalic. Extremities: extremities normal, atraumatic, no cyanosis or edema and Homans sign is negative, no sign of DVT with DTRs 2+ bilaterally Membranes: ruptured  Fetal Monitoring:  Baseline: 150 bpm, moderate variability, no accels, no decels Toco: no contractions  Labs:  No results found for this or any previous visit (from the past 24 hour(s)).  Imaging Studies:     Currently EPIC will not allow sonographic studies to automatically populate into notes.  In the meantime, copy and paste results into note or free text.  Medications:  Scheduled    . amoxicillin  500 mg Oral Q8H  . docusate sodium  100 mg Oral Daily  . erythromycin  250 mg Oral Q6H  . hydrocortisone  25 mg Rectal BID  . levothyroxine  75 mcg Oral QAC breakfast  . prenatal multivitamin  1 tablet Oral Daily  . sodium chloride  3 mL Intravenous Q12H   I have reviewed the patient's current medications.  ASSESSMENT: Patient Active Problem List    Diagnosis  . ANEMIA-IRON DEFICIENCY  . TINNITUS, CHRONIC, RIGHT  . EXTERNAL HEMORRHOIDS  . ALLERGIC RHINITIS  . HYPOTHYROIDISM  . Supervision of high-risk pregnancy  . Uterine fibroids affecting pregnancy, antepartum  . Maternal hypothyroidism, antepartum  . Subchorionic hemorrhage of placenta  . Marginal insertion of umbilical cord  . Hemorrhoids complicating pregnancy or puerperium with antenatal complication  . Preterm premature rupture of membranes in third trimester    PLAN: Continue monitoring for si/sx of chorioamnionitis Expectant management  Makai Agostinelli 06/16/2012,7:08 AM

## 2012-06-17 ENCOUNTER — Inpatient Hospital Stay (HOSPITAL_COMMUNITY): Payer: BC Managed Care – PPO

## 2012-06-17 NOTE — Progress Notes (Signed)
Ur chart review completed.  

## 2012-06-17 NOTE — Progress Notes (Signed)
[name] is a 36 y.o. G3P1011 at [redacted]w[redacted]d by best clinical estimate who is admitted for PPROM.  Fetal presentation is cephalic.  Length of Stay: 6 Days  Subjective:  No leakage since hospital day #2 Patient reports the fetal movement as active.  Patient reports uterine contraction activity as none.  Patient reports vaginal bleeding as none.  Patient describes fluid per vagina as None.  She had a BM yesterday. Vitals: Blood pressure 100/53, pulse 74, temperature 98 F (36.7 C), temperature source Oral, resp. rate 19, height 5\' 6"  (1.676 m), weight 66.679 kg (147 lb), last menstrual period 11/24/2011, SpO2 97.00%.  Physical Examination:  General appearance - alert, well appearing, and in no distress  Heart - normal rate and regular rhythm  Abdomen - soft, nontender, nondistended  Uterus- gravid, NT Fundal Height: size equals dates  Cervical Exam: Not evaluated. and found to be not evaluated/ / and fetal presentation is cephalic.  Extremities: extremities normal, atraumatic, no cyanosis or edema and Homans sign is negative, no sign of DVT with DTRs 2+ bilaterally  Membranes: ruptured  Fetal Monitoring: Baseline: 150 bpm, moderate variability, no accels, no decels  Toco: no contractions  Labs:  No results found for this or any previous visit (from the past 24 hour(s)).  Imaging Studies:  Currently EPIC will not allow sonographic studies to automatically populate into notes. In the meantime, copy and paste results into note or free text.  Medications: Scheduled  .  amoxicillin  500 mg  Oral  Q8H   .  docusate sodium  100 mg  Oral  Daily   .  erythromycin  250 mg  Oral  Q6H   .  hydrocortisone  25 mg  Rectal  BID   .  levothyroxine  75 mcg  Oral  QAC breakfast   .  prenatal multivitamin  1 tablet  Oral  Daily   .  sodium chloride  3 mL  Intravenous  Q12H   I have reviewed the patient's current medications.  ASSESSMENT:  Patient Active Problem List   Diagnosis   .  ANEMIA-IRON DEFICIENCY     .  TINNITUS, CHRONIC, RIGHT   .  EXTERNAL HEMORRHOIDS   .  ALLERGIC RHINITIS   .  HYPOTHYROIDISM   .  Supervision of high-risk pregnancy   .  Uterine fibroids affecting pregnancy, antepartum   .  Maternal hypothyroidism, antepartum   .  Subchorionic hemorrhage of placenta   .  Marginal insertion of umbilical cord   .  Hemorrhoids complicating pregnancy or puerperium with antenatal complication   .  Preterm premature rupture of membranes in third trimester   PLAN:  Continue monitoring for si/sx of chorioamnionitis  Expectant management

## 2012-06-18 ENCOUNTER — Encounter (HOSPITAL_COMMUNITY): Payer: Self-pay | Admitting: Family Medicine

## 2012-06-18 LAB — AMNISURE RUPTURE OF MEMBRANE (ROM) NOT AT ARMC: Amnisure ROM: NEGATIVE

## 2012-06-18 NOTE — Discharge Summary (Addendum)
Physician Discharge Summary  Patient ID: [name] MRN: [id number] DOB/AGE: Nov 26, 1975 36 y.o.  Admit date: 06/11/2012 Discharge date: 06/18/2012  Admission Diagnoses: #1 intrauterine pregnancy #2 premature rupture membranes #3 hypothyroidism #4 marginal cord insertion  Discharge Diagnoses:  Active Problems:  Supervision of high-risk pregnancy  Maternal hypothyroidism, antepartum  Marginal insertion of umbilical cord   Discharged Condition: good  Hospital Course: This is a 36 year old G1 P0 who is now 28 weeks and 4 days who presented days ago with complaints of leaking fluid. The patient was ferning positive and positive pooling. Patient was admitted and started on latency antibiotics and given betamethasone. Since being hospitalized, the patient has had no further leaking. Her AFI on admission was 16 which increased to 18 on 06/17/2012. Patient had an amnisure performed today which was negative. The patient is being discharged to home to followup at [facility] clinic. Patient was instructed to return if has any further leaking.  Consults: NICU  Significant Diagnostic Studies: labs: fern +, amnisure negative.  AFI 7/23 16.  AFI 7/29 18.  Treatments: antibiotics: ampicillin/amoxicillin and erythromycin  Discharge Exam:  See exam by Dr Shawnie Pons. NST: category 1  Disposition:   Discharge Orders    Future Appointments: Provider: Department: Dept Phone: Center:   06/25/2012 9:15 AM Catalina Antigua, MD [facility] [id number]     Future Orders Please Complete By Expires   Discharge patient        Medication List  As of 06/18/2012 10:42 AM   TAKE these medications         hydrocortisone 25 MG suppository   Commonly known as: ANUSOL-HC   Place 1 suppository (25 mg total) rectally 2 (two) times daily.      levothyroxine 75 MCG tablet   Commonly known as: SYNTHROID, LEVOTHROID   Take 1 tablet (75 mcg total) by mouth daily.      multivitamin-prenatal 27-0.8 MG  Tabs   Take 1 tablet by mouth daily.      vitamin C 100 MG tablet   Take 100 mg by mouth daily.           Follow-up Information    Follow up with [facility] on 06/25/2012.   Contact information:   [address] [place] [address]          Signed: Candelaria Celeste JEHIEL 06/18/2012, 10:42 AM

## 2012-06-18 NOTE — Progress Notes (Signed)
Patient ID: [name], female   DOB: 10/30/1976, 36 y.o.   MRN: [id number] FACULTY PRACTICE ANTEPARTUM(COMPREHENSIVE) NOTE  [name] is a 36 y.o. G3P1011 at [redacted]w[redacted]d by best clinical estimate who is admitted for PROM.   Fetal presentation is cephalic. Length of Stay:  7  Days  Subjective: Reports no leaking x several days.  AFI yesterday was 18 up from 16. Patient reports the fetal movement as active. Patient reports uterine contraction  activity as none. Patient reports  vaginal bleeding as none. Patient describes fluid per vagina as None.  Vitals:  Blood pressure 102/55, pulse 81, temperature 97.9 F (36.6 C), temperature source Oral, resp. rate 18, height 5\' 6"  (1.676 m), weight 66.679 kg (147 lb), last menstrual period 11/24/2011, SpO2 97.00%. Physical Examination:  General appearance - alert, well appearing, and in no distress Abdomen - soft, nontender, nondistended, no masses or organomegaly, gravid Fundal Height:  size equals dates Pelvic Exam:  examination not indicated Cervical Exam: Not evaluated.  Extremities: extremities normal, atraumatic, no cyanosis or edema    Fetal Monitoring:  Baseline: 150 bpm, Variability: Good {> 6 bpm), Accelerations: Non-reactive but appropriate for gestational age and Decelerations: Absent   Medications:  Scheduled    . amoxicillin  500 mg Oral Q8H  . docusate sodium  100 mg Oral Daily  . erythromycin  250 mg Oral Q6H  . hydrocortisone  25 mg Rectal BID  . levothyroxine  75 mcg Oral QAC breakfast  . prenatal multivitamin  1 tablet Oral Daily  . sodium chloride  3 mL Intravenous Q12H   I have reviewed the patient's current medications.  ASSESSMENT: Patient Active Problem List  Diagnosis  . ANEMIA-IRON DEFICIENCY  . TINNITUS, CHRONIC, RIGHT  . EXTERNAL HEMORRHOIDS  . ALLERGIC RHINITIS  . HYPOTHYROIDISM  . Supervision of high-risk pregnancy  . Uterine fibroids affecting pregnancy, antepartum  . Maternal hypothyroidism, antepartum  .  Subchorionic hemorrhage of placenta  . Marginal insertion of umbilical cord  . Hemorrhoids complicating pregnancy or puerperium with antenatal complication  . Preterm premature rupture of membranes in third trimester    PLAN: Check Amnisure--decisions dependent upon outcome.  PRATT,TANYA S 06/18/2012,7:35 AM

## 2012-06-20 ENCOUNTER — Encounter: Payer: Self-pay | Admitting: Obstetrics & Gynecology

## 2012-06-20 ENCOUNTER — Ambulatory Visit (INDEPENDENT_AMBULATORY_CARE_PROVIDER_SITE_OTHER): Payer: BC Managed Care – PPO | Admitting: Obstetrics & Gynecology

## 2012-06-20 VITALS — BP 120/80 | Wt 148.0 lb

## 2012-06-20 DIAGNOSIS — O418X90 Other specified disorders of amniotic fluid and membranes, unspecified trimester, not applicable or unspecified: Secondary | ICD-10-CM

## 2012-06-20 DIAGNOSIS — IMO0002 Reserved for concepts with insufficient information to code with codable children: Secondary | ICD-10-CM

## 2012-06-20 DIAGNOSIS — E079 Disorder of thyroid, unspecified: Secondary | ICD-10-CM

## 2012-06-20 DIAGNOSIS — O4590 Premature separation of placenta, unspecified, unspecified trimester: Secondary | ICD-10-CM

## 2012-06-20 DIAGNOSIS — O3410 Maternal care for benign tumor of corpus uteri, unspecified trimester: Secondary | ICD-10-CM

## 2012-06-20 DIAGNOSIS — O0990 Supervision of high risk pregnancy, unspecified, unspecified trimester: Secondary | ICD-10-CM

## 2012-06-20 DIAGNOSIS — E039 Hypothyroidism, unspecified: Secondary | ICD-10-CM

## 2012-06-20 NOTE — Progress Notes (Signed)
Routine visit. Wants to be checked for ROM again. Speculum exam entirely normal. Good FM. Denies VB, ROM, CTXs.

## 2012-06-20 NOTE — Progress Notes (Signed)
Patient would like to be checked again for fluid leaking.

## 2012-06-25 ENCOUNTER — Encounter: Payer: BC Managed Care – PPO | Admitting: Obstetrics and Gynecology

## 2012-07-10 ENCOUNTER — Ambulatory Visit (INDEPENDENT_AMBULATORY_CARE_PROVIDER_SITE_OTHER): Payer: BC Managed Care – PPO | Admitting: Family Medicine

## 2012-07-10 VITALS — BP 96/71 | Wt 152.0 lb

## 2012-07-10 DIAGNOSIS — O365990 Maternal care for other known or suspected poor fetal growth, unspecified trimester, not applicable or unspecified: Secondary | ICD-10-CM

## 2012-07-10 DIAGNOSIS — Z3480 Encounter for supervision of other normal pregnancy, unspecified trimester: Secondary | ICD-10-CM

## 2012-07-10 NOTE — Patient Instructions (Signed)
Pregnancy - Third Trimester The third trimester of pregnancy (the last 3 months) is a period of the most rapid growth for you and your baby. The baby approaches a length of 20 inches and a weight of 6 to 10 pounds. The baby is adding on fat and getting ready for life outside your body. While inside, babies have periods of sleeping and waking, suck their thumbs, and hiccups. You can often feel small contractions of the uterus. This is false labor. It is also called Braxton-Hicks contractions. This is like a practice for labor. The usual problems in this stage of pregnancy include more difficulty breathing, swelling of the hands and feet from water retention, and having to urinate more often because of the uterus and baby pressing on your bladder.  PRENATAL EXAMS  Blood work may continue to be done during prenatal exams. These tests are done to check on your health and the probable health of your baby. Blood work is used to follow your blood levels (hemoglobin). Anemia (low hemoglobin) is common during pregnancy. Iron and vitamins are given to help prevent this. You may also continue to be checked for diabetes. Some of the past blood tests may be done again.   The size of the uterus is measured during each visit. This makes sure your baby is growing properly according to your pregnancy dates.   Your blood pressure is checked every prenatal visit. This is to make sure you are not getting toxemia.   Your urine is checked every prenatal visit for infection, diabetes and protein.   Your weight is checked at each visit. This is done to make sure gains are happening at the suggested rate and that you and your baby are growing normally.   Sometimes, an ultrasound is performed to confirm the position and the proper growth and development of the baby. This is a test done that bounces harmless sound waves off the baby so your caregiver can more accurately determine due dates.   Discuss the type of pain  medication and anesthesia you will have during your labor and delivery.   Discuss the possibility and anesthesia if a Cesarean Section might be necessary.   Inform your caregiver if there is any mental or physical violence at home.  Sometimes, a specialized non-stress test, contraction stress test and biophysical profile are done to make sure the baby is not having a problem. Checking the amniotic fluid surrounding the baby is called an amniocentesis. The amniotic fluid is removed by sticking a needle into the belly (abdomen). This is sometimes done near the end of pregnancy if an early delivery is required. In this case, it is done to help make sure the baby's lungs are mature enough for the baby to live outside of the womb. If the lungs are not mature and it is unsafe to deliver the baby, an injection of cortisone medication is given to the mother 1 to 2 days before the delivery. This helps the baby's lungs mature and makes it safer to deliver the baby. CHANGES OCCURING IN THE THIRD TRIMESTER OF PREGNANCY Your body goes through many changes during pregnancy. They vary from person to person. Talk to your caregiver about changes you notice and are concerned about.  During the last trimester, you have probably had an increase in your appetite. It is normal to have cravings for certain foods. This varies from person to person and pregnancy to pregnancy.   You may begin to get stretch marks on your hips,   abdomen, and breasts. These are normal changes in the body during pregnancy. There are no exercises or medications to take which prevent this change.   Constipation may be treated with a stool softener or adding bulk to your diet. Drinking lots of fluids, fiber in vegetables, fruits, and whole grains are helpful.   Exercising is also helpful. If you have been very active up until your pregnancy, most of these activities can be continued during your pregnancy. If you have been less active, it is helpful  to start an exercise program such as walking. Consult your caregiver before starting exercise programs.   Avoid all smoking, alcohol, un-prescribed drugs, herbs and "street drugs" during your pregnancy. These chemicals affect the formation and growth of the baby. Avoid chemicals throughout the pregnancy to ensure the delivery of a healthy infant.   Backache, varicose veins and hemorrhoids may develop or get worse.   You will tire more easily in the third trimester, which is normal.   The baby's movements may be stronger and more often.   You may become short of breath easily.   Your belly button may stick out.   A yellow discharge may leak from your breasts called colostrum.   You may have a bloody mucus discharge. This usually occurs a few days to a week before labor begins.  HOME CARE INSTRUCTIONS   Keep your caregiver's appointments. Follow your caregiver's instructions regarding medication use, exercise, and diet.   During pregnancy, you are providing food for you and your baby. Continue to eat regular, well-balanced meals. Choose foods such as meat, fish, milk and other low fat dairy products, vegetables, fruits, and whole-grain breads and cereals. Your caregiver will tell you of the ideal weight gain.   A physical sexual relationship may be continued throughout pregnancy if there are no other problems such as early (premature) leaking of amniotic fluid from the membranes, vaginal bleeding, or belly (abdominal) pain.   Exercise regularly if there are no restrictions. Check with your caregiver if you are unsure of the safety of your exercises. Greater weight gain will occur in the last 2 trimesters of pregnancy. Exercising helps:   Control your weight.   Get you in shape for labor and delivery.   You lose weight after you deliver.   Rest a lot with legs elevated, or as needed for leg cramps or low back pain.   Wear a good support or jogging bra for breast tenderness during  pregnancy. This may help if worn during sleep. Pads or tissues may be used in the bra if you are leaking colostrum.   Do not use hot tubs, steam rooms, or saunas.   Wear your seat belt when driving. This protects you and your baby if you are in an accident.   Avoid raw meat, cat litter boxes and soil used by cats. These carry germs that can cause birth defects in the baby.   It is easier to loose urine during pregnancy. Tightening up and strengthening the pelvic muscles will help with this problem. You can practice stopping your urination while you are going to the bathroom. These are the same muscles you need to strengthen. It is also the muscles you would use if you were trying to stop from passing gas. You can practice tightening these muscles up 10 times a set and repeating this about 3 times per day. Once you know what muscles to tighten up, do not perform these exercises during urination. It is more likely   to cause an infection by backing up the urine.   Ask for help if you have financial, counseling or nutritional needs during pregnancy. Your caregiver will be able to offer counseling for these needs as well as refer you for other special needs.   Make a list of emergency phone numbers and have them available.   Plan on getting help from family or friends when you go home from the hospital.   Make a trial run to the hospital.   Take prenatal classes with the father to understand, practice and ask questions about the labor and delivery.   Prepare the baby's room/nursery.   Do not travel out of the city unless it is absolutely necessary and with the advice of your caregiver.   Wear only low or no heal shoes to have better balance and prevent falling.  MEDICATIONS AND DRUG USE IN PREGNANCY  Take prenatal vitamins as directed. The vitamin should contain 1 milligram of folic acid. Keep all vitamins out of reach of children. Only a couple vitamins or tablets containing iron may be fatal  to a baby or young child when ingested.   Avoid use of all medications, including herbs, over-the-counter medications, not prescribed or suggested by your caregiver. Only take over-the-counter or prescription medicines for pain, discomfort, or fever as directed by your caregiver. Do not use aspirin, ibuprofen (Motrin, Advil, Nuprin) or naproxen (Aleve) unless OK'd by your caregiver.   Let your caregiver also know about herbs you may be using.   Alcohol is related to a number of birth defects. This includes fetal alcohol syndrome. All alcohol, in any form, should be avoided completely. Smoking will cause low birth rate and premature babies.   Street/illegal drugs are very harmful to the baby. They are absolutely forbidden. A baby born to an addicted mother will be addicted at birth. The baby will go through the same withdrawal an adult does.  SEEK MEDICAL CARE IF: You have any concerns or worries during your pregnancy. It is better to call with your questions if you feel they cannot wait, rather than worry about them. DECISIONS ABOUT CIRCUMCISION You may or may not know the sex of your baby. If you know your baby is a boy, it may be time to think about circumcision. Circumcision is the removal of the foreskin of the penis. This is the skin that covers the sensitive end of the penis. There is no proven medical need for this. Often this decision is made on what is popular at the time or based upon religious beliefs and social issues. You can discuss these issues with your caregiver or pediatrician. SEEK IMMEDIATE MEDICAL CARE IF:   An unexplained oral temperature above 102 F (38.9 C) develops, or as your caregiver suggests.   You have leaking of fluid from the vagina (birth canal). If leaking membranes are suspected, take your temperature and tell your caregiver of this when you call.   There is vaginal spotting, bleeding or passing clots. Tell your caregiver of the amount and how many pads are  used.   You develop a bad smelling vaginal discharge with a change in the color from clear to white.   You develop vomiting that lasts more than 24 hours.   You develop chills or fever.   You develop shortness of breath.   You develop burning on urination.   You loose more than 2 pounds of weight or gain more than 2 pounds of weight or as suggested by your   caregiver.   You notice sudden swelling of your face, hands, and feet or legs.   You develop belly (abdominal) pain. Round ligament discomfort is a common non-cancerous (benign) cause of abdominal pain in pregnancy. Your caregiver still must evaluate you.   You develop a severe headache that does not go away.   You develop visual problems, blurred or double vision.   If you have not felt your baby move for more than 1 hour. If you think the baby is not moving as much as usual, eat something with sugar in it and lie down on your left side for an hour. The baby should move at least 4 to 5 times per hour. Call right away if your baby moves less than that.   You fall, are in a car accident or any kind of trauma.   There is mental or physical violence at home.  Document Released: 10/31/2001 Document Revised: 10/26/2011 Document Reviewed: 05/05/2009 [employer] Patient Information 2012 [employer]. Contraception Choices Contraception (birth control) is the use of any methods or devices to prevent pregnancy. Below are some methods to help avoid pregnancy. HORMONAL METHODS   Contraceptive implant. This is a thin, plastic tube containing progesterone hormone. It does not contain estrogen hormone. Your caregiver inserts the tube in the inner part of the upper arm. The tube can remain in place for up to 3 years. After 3 years, the implant must be removed. The implant prevents the ovaries from releasing an egg (ovulation), thickens the cervical mucus which prevents sperm from entering the uterus, and thins the lining of the inside of the  uterus.   Progesterone-only injections. These injections are given every 3 months by your caregiver to prevent pregnancy. This synthetic progesterone hormone stops the ovaries from releasing eggs. It also thickens cervical mucus and changes the uterine lining. This makes it harder for sperm to survive in the uterus.   Birth control pills. These pills contain estrogen and progesterone hormone. They work by stopping the egg from forming in the ovary (ovulation). Birth control pills are prescribed by a caregiver.Birth control pills can also be used to treat heavy periods.   Minipill. This type of birth control pill contains only the progesterone hormone. They are taken every day of each month and must be prescribed by your caregiver.   Birth control patch. The patch contains hormones similar to those in birth control pills. It must be changed once a week and is prescribed by a caregiver.   Vaginal ring. The ring contains hormones similar to those in birth control pills. It is left in the vagina for 3 weeks, removed for 1 week, and then a new one is put back in place. The patient must be comfortable inserting and removing the ring from the vagina.A caregiver's prescription is necessary.   Emergency contraception. Emergency contraceptives prevent pregnancy after unprotected sexual intercourse. This pill can be taken right after sex or up to 5 days after unprotected sex. It is most effective the sooner you take the pills after having sexual intercourse. Emergency contraceptive pills are available without a prescription. Check with your pharmacist. Do not use emergency contraception as your only form of birth control.  BARRIER METHODS   Female condom. This is a thin sheath (latex or rubber) that is worn over the penis during sexual intercourse. It can be used with spermicide to increase effectiveness.   Female condom. This is a soft, loose-fitting sheath that is put into the vagina before sexual  intercourse.     Diaphragm. This is a soft, latex, dome-shaped barrier that must be fitted by a caregiver. It is inserted into the vagina, along with a spermicidal jelly. It is inserted before intercourse. The diaphragm should be left in the vagina for 6 to 8 hours after intercourse.   Cervical cap. This is a round, soft, latex or plastic cup that fits over the cervix and must be fitted by a caregiver. The cap can be left in place for up to 48 hours after intercourse.   Sponge. This is a soft, circular piece of polyurethane foam. The sponge has spermicide in it. It is inserted into the vagina after wetting it and before sexual intercourse.   Spermicides. These are chemicals that kill or block sperm from entering the cervix and uterus. They come in the form of creams, jellies, suppositories, foam, or tablets. They do not require a prescription. They are inserted into the vagina with an applicator before having sexual intercourse. The process must be repeated every time you have sexual intercourse.  INTRAUTERINE CONTRACEPTION  Intrauterine device (IUD). This is a T-shaped device that is put in a woman's uterus during a menstrual period to prevent pregnancy. There are 2 types:   Copper IUD. This type of IUD is wrapped in copper wire and is placed inside the uterus. Copper makes the uterus and fallopian tubes produce a fluid that kills sperm. It can stay in place for 10 years.   Hormone IUD. This type of IUD contains the hormone progestin (synthetic progesterone). The hormone thickens the cervical mucus and prevents sperm from entering the uterus, and it also thins the uterine lining to prevent implantation of a fertilized egg. The hormone can weaken or kill the sperm that get into the uterus. It can stay in place for 5 years.  PERMANENT METHODS OF CONTRACEPTION  Female tubal ligation. This is when the woman's fallopian tubes are surgically sealed, tied, or blocked to prevent the egg from traveling to  the uterus.   Female sterilization. This is when the female has the tubes that carry sperm tied off (vasectomy).This blocks sperm from entering the vagina during sexual intercourse. After the procedure, the man can still ejaculate fluid (semen).  NATURAL PLANNING METHODS  Natural family planning. This is not having sexual intercourse or using a barrier method (condom, diaphragm, cervical cap) on days the woman could become pregnant.   Calendar method. This is keeping track of the length of each menstrual cycle and identifying when you are fertile.   Ovulation method. This is avoiding sexual intercourse during ovulation.   Symptothermal method. This is avoiding sexual intercourse during ovulation, using a thermometer and ovulation symptoms.   Post-ovulation method. This is timing sexual intercourse after you have ovulated.  Regardless of which type or method of contraception you choose, it is important that you use condoms to protect against the transmission of sexually transmitted diseases (STDs). Talk with your caregiver about which form of contraception is most appropriate for you. Document Released: 11/06/2005 Document Revised: 10/26/2011 Document Reviewed: 03/15/2011 [employer] Patient Information 2012 [employer]. Breastfeeding BENEFITS OF BREASTFEEDING For the baby  The first milk (colostrum) helps the baby's digestive system function better.   There are antibodies from the mother in the milk that help the baby fight off infections.   The baby has a lower incidence of asthma, allergies, and SIDS (sudden infant death syndrome).   The nutrients in breast milk are better than formulas for the baby and helps the baby's brain grow better.     Babies who breastfeed have less gas, colic, and constipation.  For the mother  Breastfeeding helps develop a very special bond between mother and baby.   It is more convenient, always available at the correct temperature and cheaper than formula  feeding.   It burns calories in the mother and helps with losing weight that was gained during pregnancy.   It makes the uterus contract back down to normal size faster and slows bleeding following delivery.   Breastfeeding mothers have a lower risk of developing breast cancer.  NURSE FREQUENTLY  A healthy, full-term baby may breastfeed as often as every hour or space his or her feedings to every 3 hours.   How often to nurse will vary from baby to baby. Watch your baby for signs of hunger, not the clock.   Nurse as often as the baby requests, or when you feel the need to reduce the fullness of your breasts.   Awaken the baby if it has been 3 to 4 hours since the last feeding.   Frequent feeding will help the mother make more milk and will prevent problems like sore nipples and engorgement of the breasts.  BABY'S POSITION AT THE BREAST  Whether lying down or sitting, be sure that the baby's tummy is facing your tummy.   Support the breast with 4 fingers underneath the breast and the thumb above. Make sure your fingers are well away from the nipple and baby's mouth.   Stroke the baby's lips and cheek closest to the breast gently with your finger or nipple.   When the baby's mouth is open wide enough, place all of your nipple and as much of the dark area around the nipple as possible into your baby's mouth.   Pull the baby in close so the tip of the nose and the baby's cheeks touch the breast during the feeding.  FEEDINGS  The length of each feeding varies from baby to baby and from feeding to feeding.   The baby must suck about 2 to 3 minutes for your milk to get to him or her. This is called a "let down." For this reason, allow the baby to feed on each breast as long as he or she wants. Your baby will end the feeding when he or she has received the right balance of nutrients.   To break the suction, put your finger into the corner of the baby's mouth and slide it between his or her  gums before removing your breast from his or her mouth. This will help prevent sore nipples.  REDUCING BREAST ENGORGEMENT  In the first week after your baby is born, you may experience signs of breast engorgement. When breasts are engorged, they feel heavy, warm, full, and may be tender to the touch. You can reduce engorgement if you:   Nurse frequently, every 2 to 3 hours. Mothers who breastfeed early and often have fewer problems with engorgement.   Place light ice packs on your breasts between feedings. This reduces swelling. Wrap the ice packs in a lightweight towel to protect your skin.   Apply moist hot packs to your breast for 5 to 10 minutes before each feeding. This increases circulation and helps the milk flow.   Gently massage your breast before and during the feeding.   Make sure that the baby empties at least one breast at every feeding before switching sides.   Use a breast pump to empty the breasts if your baby is sleepy or   not nursing well. You may also want to pump if you are returning to work or or you feel you are getting engorged.   Avoid bottle feeds, pacifiers or supplemental feedings of water or juice in place of breastfeeding.   Be sure the baby is latched on and positioned properly while breastfeeding.   Prevent fatigue, stress, and anemia.   Wear a supportive bra, avoiding underwire styles.   Eat a balanced diet with enough fluids.  If you follow these suggestions, your engorgement should improve in 24 to 48 hours. If you are still experiencing difficulty, call your lactation consultant or caregiver. IS MY BABY GETTING ENOUGH MILK? Sometimes, mothers worry about whether their babies are getting enough milk. You can be assured that your baby is getting enough milk if:  The baby is actively sucking and you hear swallowing.   The baby nurses at least 8 to 12 times in a 24 hour time period. Nurse your baby until he or she unlatches or falls asleep at the first  breast (at least 10 to 20 minutes), then offer the second side.   The baby is wetting 5 to 6 disposable diapers (6 to 8 cloth diapers) in a 24 hour period by 5 to 6 days of age.   The baby is having at least 2 to 3 stools every 24 hours for the first few months. Breast milk is all the food your baby needs. It is not necessary for your baby to have water or formula. In fact, to help your breasts make more milk, it is best not to give your baby supplemental feedings during the early weeks.   The stool should be soft and yellow.   The baby should gain 4 to 7 ounces per week after he is 4 days old.  TAKE CARE OF YOURSELF Take care of your breasts by:  Bathing or showering daily.   Avoiding the use of soaps on your nipples.   Start feedings on your left breast at one feeding and on your right breast at the next feeding.   You will notice an increase in your milk supply 2 to 5 days after delivery. You may feel some discomfort from engorgement, which makes your breasts very firm and often tender. Engorgement "peaks" out within 24 to 48 hours. In the meantime, apply warm moist towels to your breasts for 5 to 10 minutes before feeding. Gentle massage and expression of some milk before feeding will soften your breasts, making it easier for your baby to latch on. Wear a well fitting nursing bra and air dry your nipples for 10 to 15 minutes after each feeding.   Only use cotton bra pads.   Only use pure lanolin on your nipples after nursing. You do not need to wash it off before nursing.  Take care of yourself by:   Eating well-balanced meals and nutritious snacks.   Drinking milk, fruit juice, and water to satisfy your thirst (about 8 glasses a day).   Getting plenty of rest.   Increasing calcium in your diet (1200 mg a day).   Avoiding foods that you notice affect the baby in a bad way.  SEEK MEDICAL CARE IF:   You have any questions or difficulty with breastfeeding.   You need help.    You have a hard, red, sore area on your breast, accompanied by a fever of 100.5 F (38.1 C) or more.   Your baby is too sleepy to eat well or is having   trouble sleeping.   Your baby is wetting less than 6 diapers per day, by 5 days of age.   Your baby's skin or white part of his or her eyes is more yellow than it was in the hospital.   You feel depressed.  Document Released: 11/06/2005 Document Revised: 10/26/2011 Document Reviewed: 06/21/2009 [employer] Patient Information 2012 [employer]. 

## 2012-07-10 NOTE — Progress Notes (Signed)
S<D will get u/s for growth.

## 2012-07-17 ENCOUNTER — Ambulatory Visit (INDEPENDENT_AMBULATORY_CARE_PROVIDER_SITE_OTHER): Payer: BC Managed Care – PPO | Admitting: Obstetrics & Gynecology

## 2012-07-17 ENCOUNTER — Encounter: Payer: Self-pay | Admitting: Obstetrics & Gynecology

## 2012-07-17 DIAGNOSIS — Z3480 Encounter for supervision of other normal pregnancy, unspecified trimester: Secondary | ICD-10-CM

## 2012-07-17 NOTE — Progress Notes (Signed)
Has [place] scheduled tomorrow. S=D today.

## 2012-07-18 ENCOUNTER — Ambulatory Visit (HOSPITAL_COMMUNITY): Payer: BC Managed Care – PPO

## 2012-07-24 ENCOUNTER — Ambulatory Visit (HOSPITAL_COMMUNITY)
Admission: RE | Admit: 2012-07-24 | Discharge: 2012-07-24 | Disposition: A | Discharge status: Home / Self Care | Payer: BC Managed Care – PPO | Source: Ambulatory Visit | Attending: Family Medicine | Admitting: Family Medicine

## 2012-07-24 ENCOUNTER — Encounter: Payer: Self-pay | Admitting: Family Medicine

## 2012-07-24 DIAGNOSIS — O418X90 Other specified disorders of amniotic fluid and membranes, unspecified trimester, not applicable or unspecified: Secondary | ICD-10-CM

## 2012-07-24 DIAGNOSIS — D259 Leiomyoma of uterus, unspecified: Secondary | ICD-10-CM

## 2012-07-24 DIAGNOSIS — O365990 Maternal care for other known or suspected poor fetal growth, unspecified trimester, not applicable or unspecified: Secondary | ICD-10-CM | POA: Insufficient documentation

## 2012-07-24 DIAGNOSIS — E079 Disorder of thyroid, unspecified: Secondary | ICD-10-CM | POA: Insufficient documentation

## 2012-07-24 DIAGNOSIS — E039 Hypothyroidism, unspecified: Secondary | ICD-10-CM | POA: Insufficient documentation

## 2012-07-24 DIAGNOSIS — O0990 Supervision of high risk pregnancy, unspecified, unspecified trimester: Secondary | ICD-10-CM

## 2012-07-24 DIAGNOSIS — O3410 Maternal care for benign tumor of corpus uteri, unspecified trimester: Secondary | ICD-10-CM | POA: Insufficient documentation

## 2012-08-01 ENCOUNTER — Ambulatory Visit (INDEPENDENT_AMBULATORY_CARE_PROVIDER_SITE_OTHER): Payer: BC Managed Care – PPO | Admitting: Obstetrics & Gynecology

## 2012-08-01 ENCOUNTER — Encounter: Payer: Self-pay | Admitting: Obstetrics & Gynecology

## 2012-08-01 VITALS — BP 98/70 | Wt 155.0 lb

## 2012-08-01 DIAGNOSIS — E079 Disorder of thyroid, unspecified: Secondary | ICD-10-CM

## 2012-08-01 DIAGNOSIS — O0990 Supervision of high risk pregnancy, unspecified, unspecified trimester: Secondary | ICD-10-CM

## 2012-08-01 DIAGNOSIS — E039 Hypothyroidism, unspecified: Secondary | ICD-10-CM

## 2012-08-01 DIAGNOSIS — O99280 Endocrine, nutritional and metabolic diseases complicating pregnancy, unspecified trimester: Secondary | ICD-10-CM

## 2012-08-01 NOTE — Progress Notes (Signed)
Routine visit. 53% growth on u/s last week. Good FM. No problems Labor precautions given. Cervical cultures at N/V.

## 2012-08-07 ENCOUNTER — Encounter: Payer: Self-pay | Admitting: Family Medicine

## 2012-08-07 ENCOUNTER — Ambulatory Visit (INDEPENDENT_AMBULATORY_CARE_PROVIDER_SITE_OTHER): Payer: BC Managed Care – PPO | Admitting: Family Medicine

## 2012-08-07 VITALS — BP 112/77 | Wt 157.0 lb

## 2012-08-07 DIAGNOSIS — R300 Dysuria: Secondary | ICD-10-CM

## 2012-08-07 DIAGNOSIS — O2340 Unspecified infection of urinary tract in pregnancy, unspecified trimester: Secondary | ICD-10-CM

## 2012-08-07 DIAGNOSIS — N390 Urinary tract infection, site not specified: Secondary | ICD-10-CM

## 2012-08-07 DIAGNOSIS — O2390 Unspecified genitourinary tract infection in pregnancy, unspecified trimester: Secondary | ICD-10-CM

## 2012-08-07 DIAGNOSIS — Z3480 Encounter for supervision of other normal pregnancy, unspecified trimester: Secondary | ICD-10-CM

## 2012-08-07 LAB — OB RESULTS CONSOLE GBS: GBS: POSITIVE

## 2012-08-07 LAB — POCT URINALYSIS DIPSTICK
Glucose, UA: NEGATIVE
Nitrite, UA: NEGATIVE
Protein, UA: NEGATIVE
Urobilinogen, UA: 0.2

## 2012-08-07 MED ORDER — CEPHALEXIN 500 MG PO CAPS: 500.0000 mg | ORAL_CAPSULE | Freq: Four times a day (QID) | ORAL | Status: DC

## 2012-08-07 NOTE — Progress Notes (Signed)
Cultures today C/O dysuria check u/a and urine cx Declines flu shot

## 2012-08-07 NOTE — Patient Instructions (Addendum)
Breastfeeding BENEFITS OF BREASTFEEDING For the baby  The first milk (colostrum) helps the baby's digestive system function better.   There are antibodies from the mother in the milk that help the baby fight off infections.   The baby has a lower incidence of asthma, allergies, and SIDS (sudden infant death syndrome).   The nutrients in breast milk are better than formulas for the baby and helps the baby's brain grow better.   Babies who breastfeed have less gas, colic, and constipation.  For the mother  Breastfeeding helps develop a very special bond between mother and baby.   It is more convenient, always available at the correct temperature and cheaper than formula feeding.   It burns calories in the mother and helps with losing weight that was gained during pregnancy.   It makes the uterus contract back down to normal size faster and slows bleeding following delivery.   Breastfeeding mothers have a lower risk of developing breast cancer.  NURSE FREQUENTLY  A healthy, full-term baby may breastfeed as often as every hour or space his or her feedings to every 3 hours.   How often to nurse will vary from baby to baby. Watch your baby for signs of hunger, not the clock.   Nurse as often as the baby requests, or when you feel the need to reduce the fullness of your breasts.   Awaken the baby if it has been 3 to 4 hours since the last feeding.   Frequent feeding will help the mother make more milk and will prevent problems like sore nipples and engorgement of the breasts.  BABY'S POSITION AT THE BREAST  Whether lying down or sitting, be sure that the baby's tummy is facing your tummy.   Support the breast with 4 fingers underneath the breast and the thumb above. Make sure your fingers are well away from the nipple and baby's mouth.   Stroke the baby's lips and cheek closest to the breast gently with your finger or nipple.   When the baby's mouth is open wide enough, place  all of your nipple and as much of the dark area around the nipple as possible into your baby's mouth.   Pull the baby in close so the tip of the nose and the baby's cheeks touch the breast during the feeding.  FEEDINGS  The length of each feeding varies from baby to baby and from feeding to feeding.   The baby must suck about 2 to 3 minutes for your milk to get to him or her. This is called a "let down." For this reason, allow the baby to feed on each breast as long as he or she wants. Your baby will end the feeding when he or she has received the right balance of nutrients.   To break the suction, put your finger into the corner of the baby's mouth and slide it between his or her gums before removing your breast from his or her mouth. This will help prevent sore nipples.  REDUCING BREAST ENGORGEMENT  In the first week after your baby is born, you may experience signs of breast engorgement. When breasts are engorged, they feel heavy, warm, full, and may be tender to the touch. You can reduce engorgement if you:   Nurse frequently, every 2 to 3 hours. Mothers who breastfeed early and often have fewer problems with engorgement.   Place light ice packs on your breasts between feedings. This reduces swelling. Wrap the ice packs in a   lightweight towel to protect your skin.   Apply moist hot packs to your breast for 5 to 10 minutes before each feeding. This increases circulation and helps the milk flow.   Gently massage your breast before and during the feeding.   Make sure that the baby empties at least one breast at every feeding before switching sides.   Use a breast pump to empty the breasts if your baby is sleepy or not nursing well. You may also want to pump if you are returning to work or or you feel you are getting engorged.   Avoid bottle feeds, pacifiers or supplemental feedings of water or juice in place of breastfeeding.   Be sure the baby is latched on and positioned properly while  breastfeeding.   Prevent fatigue, stress, and anemia.   Wear a supportive bra, avoiding underwire styles.   Eat a balanced diet with enough fluids.  If you follow these suggestions, your engorgement should improve in 24 to 48 hours. If you are still experiencing difficulty, call your lactation consultant or caregiver. IS MY BABY GETTING ENOUGH MILK? Sometimes, mothers worry about whether their babies are getting enough milk. You can be assured that your baby is getting enough milk if:  The baby is actively sucking and you hear swallowing.   The baby nurses at least 8 to 12 times in a 24 hour time period. Nurse your baby until he or she unlatches or falls asleep at the first breast (at least 10 to 20 minutes), then offer the second side.   The baby is wetting 5 to 6 disposable diapers (6 to 8 cloth diapers) in a 24 hour period by 5 to 6 days of age.   The baby is having at least 2 to 3 stools every 24 hours for the first few months. Breast milk is all the food your baby needs. It is not necessary for your baby to have water or formula. In fact, to help your breasts make more milk, it is best not to give your baby supplemental feedings during the early weeks.   The stool should be soft and yellow.   The baby should gain 4 to 7 ounces per week after he is 4 days old.  TAKE CARE OF YOURSELF Take care of your breasts by:  Bathing or showering daily.   Avoiding the use of soaps on your nipples.   Start feedings on your left breast at one feeding and on your right breast at the next feeding.   You will notice an increase in your milk supply 2 to 5 days after delivery. You may feel some discomfort from engorgement, which makes your breasts very firm and often tender. Engorgement "peaks" out within 24 to 48 hours. In the meantime, apply warm moist towels to your breasts for 5 to 10 minutes before feeding. Gentle massage and expression of some milk before feeding will soften your breasts, making  it easier for your baby to latch on. Wear a well fitting nursing bra and air dry your nipples for 10 to 15 minutes after each feeding.   Only use cotton bra pads.   Only use pure lanolin on your nipples after nursing. You do not need to wash it off before nursing.  Take care of yourself by:   Eating well-balanced meals and nutritious snacks.   Drinking milk, fruit juice, and water to satisfy your thirst (about 8 glasses a day).   Getting plenty of rest.   Increasing calcium in   your diet (1200 mg a day).   Avoiding foods that you notice affect the baby in a bad way.  SEEK MEDICAL CARE IF:   You have any questions or difficulty with breastfeeding.   You need help.   You have a hard, red, sore area on your breast, accompanied by a fever of 100.5 F (38.1 C) or more.   Your baby is too sleepy to eat well or is having trouble sleeping.   Your baby is wetting less than 6 diapers per day, by 5 days of age.   Your baby's skin or white part of his or her eyes is more yellow than it was in the hospital.   You feel depressed.  Document Released: 11/06/2005 Document Revised: 10/26/2011 Document Reviewed: 06/21/2009 ExitCare Patient Information 2012 [employer]. Normal Labor and Delivery Your caregiver must first be sure you are in labor. Signs of labor include:  You may pass what is called "the mucus plug" before labor begins. This is a small amount of blood stained mucus.   Regular uterine contractions.   The time between contractions get closer together.   The discomfort and pain gradually gets more intense.   Pains are mostly located in the back.   Pains get worse when walking.   The cervix (the opening of the uterus becomes thinner (begins to efface) and opens up (dilates).  Once you are in labor and admitted into the hospital or care center, your caregiver will do the following:  A complete physical examination.   Check your vital signs (blood pressure, pulse,  temperature and the fetal heart rate).   Do a vaginal examination (using a sterile glove and lubricant) to determine:   The position (presentation) of the baby (head [vertex] or buttock first).   The level (station) of the baby's head in the birth canal.   The effacement and dilatation of the cervix.   You may have your pubic hair shaved and be given an enema depending on your caregiver and the circumstance.   An electronic monitor is usually placed on your abdomen. The monitor follows the length and intensity of the contractions, as well as the baby's heart rate.   Usually, your caregiver will insert an IV in your arm with a bottle of sugar water. This is done as a precaution so that medications can be given to you quickly during labor or delivery.  NORMAL LABOR AND DELIVERY IS DIVIDED UP INTO 3 STAGES: First Stage This is when regular contractions begin and the cervix begins to efface and dilate. This stage can last from 3 to 15 hours. The end of the first stage is when the cervix is 100% effaced and 10 centimeters dilated. Pain medications may be given by   Injection (morphine, demerol, etc.)   Regional anesthesia (spinal, caudal or epidural, anesthetics given in different locations of the spine). Paracervical pain medication may be given, which is an injection of and anesthetic on each side of the cervix.  A pregnant woman may request to have "Natural Childbirth" which is not to have any medications or anesthesia during her labor and delivery. Second Stage This is when the baby comes down through the birth canal (vagina) and is born. This can take 1 to 4 hours. As the baby's head comes down through the birth canal, you may feel like you are going to have a bowel movement. You will get the urge to bear down and push until the baby is delivered. As the baby's   head is being delivered, the caregiver will decide if an episiotomy (a cut in the perineum and vagina area) is needed to prevent  tearing of the tissue in this area. The episiotomy is sewn up after the delivery of the baby and placenta. Sometimes a mask with nitrous oxide is given for the mother to breath during the delivery of the baby to help if there is too much pain. The end of Stage 2 is when the baby is fully delivered. Then when the umbilical cord stops pulsating it is clamped and cut. Third Stage The third stage begins after the baby is completely delivered and ends after the placenta (afterbirth) is delivered. This usually takes 5 to 30 minutes. After the placenta is delivered, a medication is given either by intravenous or injection to help contract the uterus and prevent bleeding. The third stage is not painful and pain medication is usually not necessary. If an episiotomy was done, it is repaired at this time. After the delivery, the mother is watched and monitored closely for 1 to 2 hours to make sure there is no postpartum bleeding (hemorrhage). If there is a lot of bleeding, medication is given to contract the uterus and stop the bleeding. Document Released: 08/15/2008 Document Revised: 10/26/2011 Document Reviewed: 08/15/2008 ExitCare Patient Information 2012 [employer].  

## 2012-08-07 NOTE — Addendum Note (Signed)
Addended by: Reva Bores on: 08/07/2012 10:50 AM   Modules accepted: Orders

## 2012-08-07 NOTE — Progress Notes (Signed)
U/A shows leuks and blood--will treat presumptively for UTI.

## 2012-08-07 NOTE — Addendum Note (Signed)
Addended by: Vinnie Langton C on: 08/07/2012 10:37 AM   Modules accepted: Orders

## 2012-08-09 LAB — CULTURE, OB URINE

## 2012-08-13 ENCOUNTER — Encounter: Payer: Self-pay | Admitting: Family Medicine

## 2012-08-13 DIAGNOSIS — O99820 Streptococcus B carrier state complicating pregnancy: Secondary | ICD-10-CM | POA: Insufficient documentation

## 2012-08-14 ENCOUNTER — Ambulatory Visit (INDEPENDENT_AMBULATORY_CARE_PROVIDER_SITE_OTHER): Payer: BC Managed Care – PPO | Admitting: Obstetrics & Gynecology

## 2012-08-14 ENCOUNTER — Encounter: Payer: Self-pay | Admitting: Obstetrics & Gynecology

## 2012-08-14 VITALS — BP 111/73 | Wt 156.0 lb

## 2012-08-14 DIAGNOSIS — E039 Hypothyroidism, unspecified: Secondary | ICD-10-CM

## 2012-08-14 DIAGNOSIS — O0990 Supervision of high risk pregnancy, unspecified, unspecified trimester: Secondary | ICD-10-CM

## 2012-08-14 DIAGNOSIS — Z3480 Encounter for supervision of other normal pregnancy, unspecified trimester: Secondary | ICD-10-CM

## 2012-08-14 DIAGNOSIS — IMO0002 Reserved for concepts with insufficient information to code with codable children: Secondary | ICD-10-CM

## 2012-08-14 DIAGNOSIS — E079 Disorder of thyroid, unspecified: Secondary | ICD-10-CM

## 2012-08-14 NOTE — Progress Notes (Signed)
Routine visit. Labor precautions reviewed. No OB problems. We discussed her need for ABX in labor due to +GBS.

## 2012-08-21 ENCOUNTER — Ambulatory Visit (INDEPENDENT_AMBULATORY_CARE_PROVIDER_SITE_OTHER): Payer: BC Managed Care – PPO | Admitting: Obstetrics & Gynecology

## 2012-08-21 VITALS — BP 118/77 | Wt 158.0 lb

## 2012-08-21 DIAGNOSIS — O228X9 Other venous complications in pregnancy, unspecified trimester: Secondary | ICD-10-CM

## 2012-08-21 DIAGNOSIS — Z3480 Encounter for supervision of other normal pregnancy, unspecified trimester: Secondary | ICD-10-CM

## 2012-08-21 DIAGNOSIS — O99280 Endocrine, nutritional and metabolic diseases complicating pregnancy, unspecified trimester: Secondary | ICD-10-CM

## 2012-08-21 DIAGNOSIS — O0990 Supervision of high risk pregnancy, unspecified, unspecified trimester: Secondary | ICD-10-CM

## 2012-08-21 DIAGNOSIS — K649 Unspecified hemorrhoids: Secondary | ICD-10-CM

## 2012-08-21 DIAGNOSIS — Z23 Encounter for immunization: Secondary | ICD-10-CM

## 2012-08-21 DIAGNOSIS — O2243 Hemorrhoids in pregnancy, third trimester: Secondary | ICD-10-CM

## 2012-08-21 MED ORDER — HYDROCORTISONE ACETATE 25 MG RE SUPP: 25.0000 mg | Freq: Two times a day (BID) | RECTAL | Status: DC

## 2012-08-21 MED ORDER — INFLUENZA VIRUS VACC SPLIT PF IM SUSP: 0.5000 mL | Freq: Once | INTRAMUSCULAR | Status: DC

## 2012-08-21 NOTE — Progress Notes (Signed)
GBS positive, patient informed of need to treat during labor. Declines pelvic exam today, will do one next week. Flu shot given today.  No other complaints or concerns.  Fetal movement and labor precautions reviewed.

## 2012-08-21 NOTE — Patient Instructions (Signed)
Return to clinic for any obstetric concerns or go to [facility] for evaluation  

## 2012-08-28 ENCOUNTER — Ambulatory Visit (INDEPENDENT_AMBULATORY_CARE_PROVIDER_SITE_OTHER): Payer: BC Managed Care – PPO | Admitting: Obstetrics & Gynecology

## 2012-08-28 VITALS — BP 103/69 | Wt 158.0 lb

## 2012-08-28 DIAGNOSIS — O0990 Supervision of high risk pregnancy, unspecified, unspecified trimester: Secondary | ICD-10-CM

## 2012-08-28 DIAGNOSIS — K649 Unspecified hemorrhoids: Secondary | ICD-10-CM

## 2012-08-28 DIAGNOSIS — E039 Hypothyroidism, unspecified: Secondary | ICD-10-CM

## 2012-08-28 DIAGNOSIS — O3410 Maternal care for benign tumor of corpus uteri, unspecified trimester: Secondary | ICD-10-CM

## 2012-08-28 DIAGNOSIS — E079 Disorder of thyroid, unspecified: Secondary | ICD-10-CM

## 2012-08-28 DIAGNOSIS — IMO0002 Reserved for concepts with insufficient information to code with codable children: Secondary | ICD-10-CM

## 2012-08-28 DIAGNOSIS — O228X9 Other venous complications in pregnancy, unspecified trimester: Secondary | ICD-10-CM

## 2012-08-28 NOTE — Progress Notes (Signed)
EDD 09/05/12, will return then for NST. Needs NST and BPP at [facility]  on on 10/21 or10/22 if still pregnant, plan to schedule IOL on 10/24 or 10/25 if needed.  No other complaints or concerns.  Fetal movement and labor precautions reviewed.

## 2012-08-28 NOTE — Patient Instructions (Signed)
Return to clinic for any obstetric concerns or go to [facility] for evaluation  

## 2012-09-05 ENCOUNTER — Telehealth (HOSPITAL_COMMUNITY): Payer: Self-pay | Admitting: *Deleted

## 2012-09-05 ENCOUNTER — Ambulatory Visit (INDEPENDENT_AMBULATORY_CARE_PROVIDER_SITE_OTHER): Payer: BC Managed Care – PPO | Admitting: Obstetrics & Gynecology

## 2012-09-05 VITALS — BP 110/78 | Wt 159.0 lb

## 2012-09-05 DIAGNOSIS — Z3480 Encounter for supervision of other normal pregnancy, unspecified trimester: Secondary | ICD-10-CM

## 2012-09-05 NOTE — Patient Instructions (Addendum)
Labor Induction  Most women go into labor on their own between 37 and 42 weeks of the pregnancy. When this does not happen or when there is a medical need, medicine or other methods may be used to induce labor. Labor induction causes a pregnant woman's uterus to contract. It also causes the cervix to soften (ripen), open (dilate), and thin out (efface). Usually, labor is not induced before 39 weeks of the pregnancy unless there is a problem with the baby or mother. Whether your labor will be induced depends on a number of factors, including the following:  The medical condition of you and the baby.  How many weeks along you are.  The status of baby's lung maturity.  The condition of the cervix.  The position of the baby. REASONS FOR LABOR INDUCTION  The health of the baby or mother is at risk.  The pregnancy is overdue by 1 week or more.  The water breaks but labor does not start on its own.  The mother has a health condition or serious illness such as high blood pressure, infection, placental abruption, or diabetes.  The amniotic fluid amounts are low around the baby.  The baby is distressed. REASONS TO NOT INDUCE LABOR Labor induction may not be a good idea if:  It is shown that your baby does not tolerate labor.  An induction is just more convenient.  You want the baby to be born on a certain date, like a holiday.  You have had previous surgeries on your uterus, such as a myomectomy or the removal of fibroids.  Your placenta lies very low in the uterus and blocks the opening of the cervix (placenta previa).  Your baby is not in a head down position.  The umbilical cord drops down into the birth canal in front of the baby. This could cut off the baby's blood and oxygen supply.  You have had a previous cesarean delivery.  There areunusual circumstances, such as the baby being extremely premature. RISKS AND COMPLICATIONS Problems may occur in the process of induction  and plans may need to be modified as a situation unfolds. Some of the risks of induction include:  Change in fetal heart rate, such as too high, too low, or erratic.  Risk of fetal distress.  Risk of infection to mother and baby.  Increased chance of having a cesarean delivery.  The rare, but increased chance that the placenta will separate from the uterus (abruption).  Uterine rupture (very rare). When induction is needed for medical reasons, the benefits of induction may outweigh the risks. BEFORE THE PROCEDURE Your caregiver will check your cervix and the baby's position. This will help your caregiver decide if you are far enough along for an induction to work. PROCEDURE Several methods of labor induction may be used, such as:   Taking prostaglandin medicine to dilate and ripen the cervix. The medicine will also start contractions. It can be taken by mouth or by inserting a suppository into the vagina.  A thin tube (catheter) with a balloon on the end may be inserted into your vagina to dilate the cervix. Once inserted, the balloon expands with water, which causes the cervix to open.  Striping the membranes. Your caregiver inserts a finger between the cervix and membranes, which causes the cervix to be stretched and may cause the uterus to contract. This is often done during an office visit. You will be sent home to wait for the contractions to begin. You will   then come in for an induction.  Breaking the water. Your caregiver will make a hole in the amniotic sac using a small instrument. Once the amniotic sac breaks, contractions should begin. This may still take hours to see an effect.  Taking medicine to trigger or strengthen contractions. This medicine is given intravenously through a tube in your arm. All of the methods of induction, besides stripping the membranes, will be done in the hospital. Induction is done in the hospital so that you and the baby can be carefully  monitored. AFTER THE PROCEDURE Some inductions can take up to 2 or 3 days. Depending on the cervix, it usually takes less time. It takes longer when you are induced early in the pregnancy or if this is your first pregnancy. If a mother is still pregnant and the induction has been going on for 2 to 3 days, either the mother will be sent home or a cesarean delivery will be needed. Document Released: 03/28/2007 Document Revised: 01/29/2012 Document Reviewed: 09/11/2011 ExitCare Patient Information 2013 [employer].  

## 2012-09-05 NOTE — Telephone Encounter (Signed)
Preadmission screen  

## 2012-09-05 NOTE — Progress Notes (Signed)
Pt with no s/sx of labor.  Will schedule induction of labor in 1 week at [redacted] weeks gestation.  Juanmanuel Marohl L. Harraway-Smith, M.D., Evern Core

## 2012-09-10 ENCOUNTER — Encounter: Payer: Self-pay | Admitting: Obstetrics & Gynecology

## 2012-09-10 ENCOUNTER — Ambulatory Visit (INDEPENDENT_AMBULATORY_CARE_PROVIDER_SITE_OTHER): Payer: BC Managed Care – PPO | Admitting: Obstetrics & Gynecology

## 2012-09-10 VITALS — BP 103/75 | Wt 160.0 lb

## 2012-09-10 DIAGNOSIS — O0990 Supervision of high risk pregnancy, unspecified, unspecified trimester: Secondary | ICD-10-CM

## 2012-09-10 DIAGNOSIS — O479 False labor, unspecified: Secondary | ICD-10-CM

## 2012-09-10 DIAGNOSIS — Z3480 Encounter for supervision of other normal pregnancy, unspecified trimester: Secondary | ICD-10-CM

## 2012-09-10 NOTE — Progress Notes (Signed)
Routine visit. No problems. Good FM. IOL scheduled for 40 weeks. NST- reactive. Labor precautions. Kick counts reviewed.

## 2012-09-12 ENCOUNTER — Encounter (HOSPITAL_COMMUNITY): Payer: Self-pay

## 2012-09-12 ENCOUNTER — Inpatient Hospital Stay (HOSPITAL_COMMUNITY): Payer: BC Managed Care – PPO | Admitting: Anesthesiology

## 2012-09-12 ENCOUNTER — Inpatient Hospital Stay (HOSPITAL_COMMUNITY)
Admission: RE | Admit: 2012-09-12 | Discharge: 2012-09-14 | DRG: 373 | Disposition: A | Discharge status: Home / Self Care | Payer: BC Managed Care – PPO | Source: Ambulatory Visit | Attending: Obstetrics and Gynecology | Admitting: Obstetrics and Gynecology

## 2012-09-12 ENCOUNTER — Encounter (HOSPITAL_COMMUNITY): Payer: Self-pay | Admitting: Anesthesiology

## 2012-09-12 DIAGNOSIS — O99892 Other specified diseases and conditions complicating childbirth: Secondary | ICD-10-CM | POA: Diagnosis present

## 2012-09-12 DIAGNOSIS — O99284 Endocrine, nutritional and metabolic diseases complicating childbirth: Secondary | ICD-10-CM | POA: Diagnosis present

## 2012-09-12 DIAGNOSIS — Z22330 Carrier of Group B streptococcus: Secondary | ICD-10-CM

## 2012-09-12 DIAGNOSIS — E079 Disorder of thyroid, unspecified: Secondary | ICD-10-CM

## 2012-09-12 DIAGNOSIS — O480 Post-term pregnancy: Secondary | ICD-10-CM

## 2012-09-12 DIAGNOSIS — O9989 Other specified diseases and conditions complicating pregnancy, childbirth and the puerperium: Secondary | ICD-10-CM

## 2012-09-12 DIAGNOSIS — E039 Hypothyroidism, unspecified: Secondary | ICD-10-CM | POA: Diagnosis present

## 2012-09-12 LAB — CBC
Hemoglobin: 12.1 g/dL (ref 12.0–15.0)
Platelets: 170 10*3/uL (ref 150–400)
RBC: 3.91 MIL/uL (ref 3.87–5.11)
WBC: 8.8 10*3/uL (ref 4.0–10.5)

## 2012-09-12 LAB — RPR: RPR Ser Ql: NONREACTIVE

## 2012-09-12 MED ORDER — ZOLPIDEM TARTRATE 5 MG PO TABS: 5.0000 mg | ORAL_TABLET | Freq: Every evening | ORAL | Status: DC | PRN

## 2012-09-12 MED ORDER — IBUPROFEN 600 MG PO TABS
600.0000 mg | ORAL_TABLET | Freq: Four times a day (QID) | ORAL | Status: DC
  Administered 2012-09-13 – 2012-09-14 (×6): 600 mg via ORAL
  Filled 2012-09-12 (×6): qty 1

## 2012-09-12 MED ORDER — DIPHENHYDRAMINE HCL 50 MG/ML IJ SOLN: 12.5000 mg | INTRAMUSCULAR | Status: DC | PRN

## 2012-09-12 MED ORDER — ONDANSETRON HCL 4 MG/2ML IJ SOLN: 4.0000 mg | Freq: Four times a day (QID) | INTRAMUSCULAR | Status: DC | PRN

## 2012-09-12 MED ORDER — FENTANYL CITRATE 0.05 MG/ML IJ SOLN
100.0000 ug | INTRAMUSCULAR | Status: DC | PRN
  Administered 2012-09-12: 100 ug via INTRAVENOUS

## 2012-09-12 MED ORDER — PRENATAL MULTIVITAMIN CH
1.0000 | ORAL_TABLET | Freq: Every day | ORAL | Status: DC
  Administered 2012-09-13 – 2012-09-14 (×2): 1 via ORAL
  Filled 2012-09-12 (×2): qty 1

## 2012-09-12 MED ORDER — LIDOCAINE HCL (PF) 1 % IJ SOLN
30.0000 mL | INTRAMUSCULAR | Status: DC | PRN
  Administered 2012-09-12: 30 mL via SUBCUTANEOUS
  Filled 2012-09-12: qty 30

## 2012-09-12 MED ORDER — SENNOSIDES-DOCUSATE SODIUM 8.6-50 MG PO TABS
2.0000 | ORAL_TABLET | Freq: Every day | ORAL | Status: DC
  Administered 2012-09-13: 2 via ORAL

## 2012-09-12 MED ORDER — PHENYLEPHRINE 40 MCG/ML (10ML) SYRINGE FOR IV PUSH (FOR BLOOD PRESSURE SUPPORT): 80.0000 ug | PREFILLED_SYRINGE | INTRAVENOUS | Status: DC | PRN

## 2012-09-12 MED ORDER — ACETAMINOPHEN 325 MG PO TABS: 650.0000 mg | ORAL_TABLET | ORAL | Status: DC | PRN

## 2012-09-12 MED ORDER — SIMETHICONE 80 MG PO CHEW: 80.0000 mg | CHEWABLE_TABLET | ORAL | Status: DC | PRN

## 2012-09-12 MED ORDER — TERBUTALINE SULFATE 1 MG/ML IJ SOLN: 0.2500 mg | Freq: Once | INTRAMUSCULAR | Status: DC | PRN

## 2012-09-12 MED ORDER — CITRIC ACID-SODIUM CITRATE 334-500 MG/5ML PO SOLN: 30.0000 mL | ORAL | Status: DC | PRN

## 2012-09-12 MED ORDER — PHENYLEPHRINE 40 MCG/ML (10ML) SYRINGE FOR IV PUSH (FOR BLOOD PRESSURE SUPPORT)
80.0000 ug | PREFILLED_SYRINGE | INTRAVENOUS | Status: DC | PRN
  Filled 2012-09-12: qty 5

## 2012-09-12 MED ORDER — OXYTOCIN 40 UNITS IN LACTATED RINGERS INFUSION - SIMPLE MED
1.0000 m[IU]/min | INTRAVENOUS | Status: DC
  Administered 2012-09-12: 2 m[IU]/min via INTRAVENOUS
  Filled 2012-09-12: qty 1000

## 2012-09-12 MED ORDER — OXYCODONE-ACETAMINOPHEN 5-325 MG PO TABS
1.0000 | ORAL_TABLET | ORAL | Status: DC | PRN
  Filled 2012-09-12: qty 2

## 2012-09-12 MED ORDER — LACTATED RINGERS IV SOLN
INTRAVENOUS | Status: DC
  Administered 2012-09-12 (×2): via INTRAVENOUS

## 2012-09-12 MED ORDER — EPHEDRINE 5 MG/ML INJ: 10.0000 mg | INTRAVENOUS | Status: DC | PRN

## 2012-09-12 MED ORDER — FENTANYL 2.5 MCG/ML BUPIVACAINE 1/10 % EPIDURAL INFUSION (WH - ANES)
14.0000 mL/h | INTRAMUSCULAR | Status: DC
  Administered 2012-09-12: 14 mL/h via EPIDURAL
  Filled 2012-09-12: qty 125

## 2012-09-12 MED ORDER — BENZOCAINE-MENTHOL 20-0.5 % EX AERO: 1.0000 "application " | INHALATION_SPRAY | CUTANEOUS | Status: DC | PRN

## 2012-09-12 MED ORDER — TETANUS-DIPHTH-ACELL PERTUSSIS 5-2.5-18.5 LF-MCG/0.5 IM SUSP
0.5000 mL | Freq: Once | INTRAMUSCULAR | Status: AC
  Administered 2012-09-13: 0.5 mL via INTRAMUSCULAR
  Filled 2012-09-12: qty 0.5

## 2012-09-12 MED ORDER — WITCH HAZEL-GLYCERIN EX PADS: 1.0000 "application " | MEDICATED_PAD | CUTANEOUS | Status: DC | PRN

## 2012-09-12 MED ORDER — IBUPROFEN 600 MG PO TABS
600.0000 mg | ORAL_TABLET | Freq: Four times a day (QID) | ORAL | Status: DC | PRN
  Administered 2012-09-12: 600 mg via ORAL
  Filled 2012-09-12: qty 1

## 2012-09-12 MED ORDER — ONDANSETRON HCL 4 MG PO TABS
4.0000 mg | ORAL_TABLET | ORAL | Status: DC | PRN
Start: 2012-09-12 — End: 2012-09-14

## 2012-09-12 MED ORDER — OXYTOCIN 40 UNITS IN LACTATED RINGERS INFUSION - SIMPLE MED: 62.5000 mL/h | INTRAVENOUS | Status: DC

## 2012-09-12 MED ORDER — EPHEDRINE 5 MG/ML INJ
10.0000 mg | INTRAVENOUS | Status: DC | PRN
  Filled 2012-09-12: qty 4

## 2012-09-12 MED ORDER — ONDANSETRON HCL 4 MG/2ML IJ SOLN: 4.0000 mg | INTRAMUSCULAR | Status: DC | PRN

## 2012-09-12 MED ORDER — FENTANYL CITRATE 0.05 MG/ML IJ SOLN
100.0000 ug | INTRAMUSCULAR | Status: DC | PRN
  Filled 2012-09-12: qty 2

## 2012-09-12 MED ORDER — LANOLIN HYDROUS EX OINT: TOPICAL_OINTMENT | CUTANEOUS | Status: DC | PRN

## 2012-09-12 MED ORDER — DIPHENHYDRAMINE HCL 25 MG PO CAPS: 25.0000 mg | ORAL_CAPSULE | Freq: Four times a day (QID) | ORAL | Status: DC | PRN

## 2012-09-12 MED ORDER — MISOPROSTOL 25 MCG QUARTER TABLET
25.0000 ug | ORAL_TABLET | ORAL | Status: DC | PRN
  Administered 2012-09-12: 25 ug via VAGINAL
  Filled 2012-09-12 (×2): qty 0.25

## 2012-09-12 MED ORDER — LEVOTHYROXINE SODIUM 75 MCG PO TABS
75.0000 ug | ORAL_TABLET | Freq: Every day | ORAL | Status: DC
  Administered 2012-09-13 – 2012-09-14 (×2): 75 ug via ORAL
  Filled 2012-09-12 (×3): qty 1

## 2012-09-12 MED ORDER — PENICILLIN G POTASSIUM 5000000 UNITS IJ SOLR
5.0000 10*6.[IU] | Freq: Once | INTRAMUSCULAR | Status: AC
  Administered 2012-09-12: 5 10*6.[IU] via INTRAVENOUS
  Filled 2012-09-12: qty 5

## 2012-09-12 MED ORDER — LIDOCAINE HCL (PF) 1 % IJ SOLN
INTRAMUSCULAR | Status: DC | PRN
  Administered 2012-09-12 (×2): 5 mL

## 2012-09-12 MED ORDER — PENICILLIN G POTASSIUM 5000000 UNITS IJ SOLR
2.5000 10*6.[IU] | INTRAVENOUS | Status: DC
  Administered 2012-09-12 (×2): 2.5 10*6.[IU] via INTRAVENOUS
  Filled 2012-09-12 (×5): qty 2.5

## 2012-09-12 MED ORDER — LACTATED RINGERS IV SOLN: 500.0000 mL | INTRAVENOUS | Status: DC | PRN

## 2012-09-12 MED ORDER — LACTATED RINGERS IV SOLN: 500.0000 mL | Freq: Once | INTRAVENOUS | Status: DC

## 2012-09-12 MED ORDER — OXYCODONE-ACETAMINOPHEN 5-325 MG PO TABS: 1.0000 | ORAL_TABLET | ORAL | Status: DC | PRN

## 2012-09-12 MED ORDER — OXYTOCIN BOLUS FROM INFUSION
500.0000 mL | INTRAVENOUS | Status: DC
  Administered 2012-09-12: 500 mL via INTRAVENOUS
  Filled 2012-09-12 (×60): qty 500

## 2012-09-12 MED ORDER — DIBUCAINE 1 % RE OINT: 1.0000 "application " | TOPICAL_OINTMENT | RECTAL | Status: DC | PRN

## 2012-09-12 MED ORDER — LEVOTHYROXINE SODIUM 75 MCG PO TABS
75.0000 ug | ORAL_TABLET | Freq: Every day | ORAL | Status: DC
  Filled 2012-09-12 (×2): qty 1

## 2012-09-12 NOTE — Anesthesia Preprocedure Evaluation (Signed)

## 2012-09-12 NOTE — Anesthesia Procedure Notes (Signed)
Epidural Patient location during procedure: OB Start time: 09/12/2012 7:44 PM  Staffing Anesthesiologist: Brayton Caves R Performed by: anesthesiologist   Preanesthetic Checklist Completed: patient identified, site marked, surgical consent, pre-op evaluation, timeout performed, IV checked, risks and benefits discussed and monitors and equipment checked  Epidural Patient position: sitting Prep: site prepped and draped and DuraPrep Patient monitoring: continuous pulse ox and blood pressure Approach: midline Injection technique: LOR air and LOR saline  Needle:  Needle type: Tuohy  Needle gauge: 17 G Needle length: 9 cm and 9 Needle insertion depth: 5 cm cm Catheter type: closed end flexible Catheter size: 19 Gauge Catheter at skin depth: 10 cm Test dose: negative  Assessment Events: blood not aspirated, injection not painful, no injection resistance, negative IV test and no paresthesia  Additional Notes Patient identified.  Risk benefits discussed including failed block, incomplete pain control, headache, nerve damage, paralysis, blood pressure changes, nausea, vomiting, reactions to medication both toxic or allergic, and postpartum back pain.  Patient expressed understanding and wished to proceed.  All questions were answered.  Sterile technique used throughout procedure and epidural site dressed with sterile barrier dressing. No paresthesia or other complications noted.The patient did not experience any signs of intravascular injection such as tinnitus or metallic taste in mouth nor signs of intrathecal spread such as rapid motor block. Please see nursing notes for vital signs.

## 2012-09-12 NOTE — Progress Notes (Signed)
[name] is a 36 y.o. G3P1011 at [redacted]w[redacted]d admitted for induction of labor due to post-date. Pt seen in [facility] for hypothyroidism and marginal cord insertion.  Subjective: Pt reports feeling contractions. Husband at bedside  Objective: BP 137/85   Pulse 71   Temp 98 F (36.7 C) (Oral)   Resp 16   Ht 5\' 2"  (1.575 m)   Wt 72.576 kg (160 lb)   BMI 29.26 kg/m2   LMP 11/24/2011      FHT:  FHR: 135 bpm, variability: moderate,  accelerations:  Present,  decelerations:  Absent UC:   regular, every 5 minutes, lasting 50-60 seconds SVE:   Dilation: 3 Effacement (%): 40 Station: -3 Exam by:: CNM   Labs: Lab Results  Component Value Date   WBC 8.8 09/12/2012   HGB 12.1 09/12/2012   HCT 37.9 09/12/2012   MCV 96.9 09/12/2012   PLT 170 09/12/2012    Assessment / Plan: Induction of labor due to postterm. - s/p cytotec  Labor: progressed well on cytotec, start pitocin at this time Fetal Wellbeing:  Category I Pain Control:  Labor support without medications Anticipated MOD:  NSVD  Harriet Masson 09/12/2012, 3:22 PM

## 2012-09-12 NOTE — H&P (Signed)
[name] is a 36 y.o. female presenting for induction of labor due to GA of [redacted] weeks 0 days. Seen in [id number] [facility] for hypothyroidism and marginal cord insertion. Transferred care from [place]. See prenatal record in media tab  History Contractions: none felt Fluid leakage: none. Possible fluid leakage reported in July but has resolved and none since Vaginal bleeding: none Fetal movement: pt has felt regular fetal movement within the past hour   OB History    Grav Para Term Preterm Abortions TAB SAB Ect Mult Living   3 1 1  0 1 0 1 0 0 1     Past Medical History  Diagnosis Date   Preterm labor    Past Surgical History  Procedure Date   Hemrrhoidectomy     age 69 or 19   Family History: family history includes Stroke in her father.  There is no history of Other. Social History:  reports that she has never smoked. She has never used smokeless tobacco. She reports that she does not drink alcohol or use illicit drugs.   Prenatal Transfer Tool  Maternal Diabetes: No Genetic Screening: Normal Maternal Ultrasounds/Referrals: Abnormal:  Findings:   Other:marginal cord insertion Fetal Ultrasounds or other Referrals:  None Maternal Substance Abuse:  No Significant Maternal Medications:  Meds include: Other: Synthroid Significant Maternal Lab Results:  Lab values include: Group B Strep positive Other Comments:  None  Review of Systems  Constitutional: Negative for fever and chills.  Eyes: Negative for blurred vision and double vision.  Cardiovascular: Negative for chest pain and palpitations.       Patient has noticed mild leg swelling throughout her pregnancy but denies any acute or increased swelling over the past few days  Gastrointestinal: Negative for abdominal pain.  Neurological: Negative for headaches.    Dilation: 1.5 Effacement (%): Thick Station: Ballotable Exam by:: Resident Blood pressure 118/78, pulse 88, temperature 98 F (36.7 C), temperature source Oral, resp. rate  16, height 5\' 2"  (1.575 m), weight 72.576 kg (160 lb), last menstrual period 11/24/2011. Exam Physical Exam  Constitutional: She appears well-developed and well-nourished. No distress.     Prenatal labs: ABO, Rh: --/--/O POS (07/23 2215) Antibody: NEG (07/23 2215) Rubella: 74.1 (02/11 1133) RPR: NON REACTIVE (07/23 2215)  HBsAg: NEGATIVE (02/11 1133)  HIV: NON REACTIVE (07/23 1624)  GBS: Positive (09/18 0000)   Assessment/Plan: [id number] @[redacted]w[redacted]d  female presenting for induction of labor due to post-dates  # Induction of labor - cervix is 1.5cm, thick, ballotable - will start Cytotec and re-examine in 4 hours  #GBS positive - will give Penicillin  Harriet Masson 09/12/2012, 8:59 AM

## 2012-09-12 NOTE — H&P (Signed)
Attestation of Attending Supervision of Advanced Practitioner (CNM/NP): Evaluation and management procedures were performed by the Advanced Practitioner under my supervision and collaboration.  I have reviewed the Advanced Practitioner's note and chart, and I agree with the management and plan.  Samuella Rasool 09/12/2012 4:37 PM   

## 2012-09-13 LAB — CBC
HCT: 30 % — ABNORMAL LOW (ref 36.0–46.0)
MCH: 31.2 pg (ref 26.0–34.0)
MCHC: 32.7 g/dL (ref 30.0–36.0)
MCV: 95.5 fL (ref 78.0–100.0)
Platelets: 159 10*3/uL (ref 150–400)
RDW: 14.5 % (ref 11.5–15.5)

## 2012-09-13 NOTE — Addendum Note (Signed)
Addendum  created 09/13/12 0754 by Jhonnie Garner, CRNA   Modules edited:Charges VN, Notes Section

## 2012-09-13 NOTE — Anesthesia Postprocedure Evaluation (Signed)
Anesthesia Post Note  Patient: [name]  Procedure(s) Performed: * No procedures listed *  Anesthesia type: Epidural  Patient location: Mother/Baby  Post pain: Pain level controlled  Post assessment: Post-op Vital signs reviewed  Last Vitals:  Filed Vitals:   09/13/12 0652  BP: 97/67  Pulse: 102  Temp: 37.2 C  Resp: 18    Post vital signs: Reviewed  Level of consciousness: awake  Complications: No apparent anesthesia complications

## 2012-09-13 NOTE — Progress Notes (Signed)
Post Partum Day 1 Subjective: up ad lib, voiding, tolerating PO and pt does report dizziness when standing  Objective: Blood pressure 97/67, pulse 102, temperature 98.9 F (37.2 C), temperature source Oral, resp. rate 18, height 5\' 2"  (1.575 m), weight 72.576 kg (160 lb), last menstrual period 11/24/2011, SpO2 99.00%, unknown if currently breastfeeding.  Physical Exam:  General: alert, cooperative and no distress Lochia: appropriate Uterine Fundus: firm Incision: N/A DVT Evaluation: No evidence of DVT seen on physical exam. Negative Homan's sign. No cords or calf tenderness. No significant calf/ankle edema.   Basename 09/12/12 0915  HGB 12.1  HCT 37.9    Assessment/Plan: Plan for discharge tomorrow, Breastfeeding and Contraception condoms Pt desires d/c in 24 hours from delivery, tonight at 8pm, if possible. CBC ordered for this am   LOS: 1 day   LEFTWICH-KIRBY, Sudais Banghart 09/13/2012, 7:32 AM

## 2012-09-14 MED ORDER — IBUPROFEN 600 MG PO TABS: 600.0000 mg | ORAL_TABLET | Freq: Four times a day (QID) | ORAL | Status: DC

## 2012-09-14 MED ORDER — ACETAMINOPHEN-CODEINE 300-30 MG PO TABS: 1.0000 | ORAL_TABLET | ORAL | Status: DC | PRN

## 2012-09-29 NOTE — Discharge Summary (Signed)
Obstetric Discharge Summary Reason for Admission: induction of labor Prenatal Procedures: NST, ultrasound and BPP Intrapartum Procedures: spontaneous vaginal delivery Postpartum Procedures: none Complications-Operative and Postpartum: 2nd degree perineal laceration  CBC    Component Value Date/Time   WBC 11.8* 09/13/2012 0737   RBC 3.14* 09/13/2012 0737   HGB 9.8* 09/13/2012 0737   HCT 30.0* 09/13/2012 0737   PLT 159 09/13/2012 0737   MCV 95.5 09/13/2012 0737   MCH 31.2 09/13/2012 0737   MCHC 32.7 09/13/2012 0737   RDW 14.5 09/13/2012 0737   LYMPHSABS 1.3 01/01/2012 1133   MONOABS 0.6 01/01/2012 1133   EOSABS 0.0 01/01/2012 1133   BASOSABS 0.0 01/01/2012 1133     Physical Exam:  Vitals:  97.8  98/64  78  16 General: alert, cooperative and appears stated age Lochia: appropriate Uterine Fundus: firm Incision: n/a DVT Evaluation: No evidence of DVT seen on physical exam. Negative Homan's sign.  Discharge Diagnoses: Term Pregnancy-delivered  Discharge Information: Date: 09/29/2012 Activity: pelvic rest Diet: routine Medications: Tylenol #3, Ibuprofen and Synthroid Condition: stable Instructions: refer to practice specific booklet Discharge to: home   Newborn Data: Live born female Birth Weight: 7 lb 3 oz  APGAR: 8, 9  Home with mother.

## 2012-10-14 NOTE — Progress Notes (Signed)
I have seen this patient and agree with the above PA student's note.  LEFTWICH-KIRBY, LISA Certified Nurse-Midwife 

## 2012-10-24 ENCOUNTER — Encounter: Payer: Self-pay | Admitting: Obstetrics & Gynecology

## 2012-10-24 ENCOUNTER — Ambulatory Visit (INDEPENDENT_AMBULATORY_CARE_PROVIDER_SITE_OTHER): Payer: BC Managed Care – PPO | Admitting: Obstetrics & Gynecology

## 2012-10-24 DIAGNOSIS — O872 Hemorrhoids in the puerperium: Secondary | ICD-10-CM

## 2012-10-24 DIAGNOSIS — K649 Unspecified hemorrhoids: Secondary | ICD-10-CM

## 2012-10-24 DIAGNOSIS — O878 Other venous complications in the puerperium: Secondary | ICD-10-CM

## 2012-10-24 MED ORDER — HYDROCORTISONE ACETATE 25 MG RE SUPP: 25.0000 mg | Freq: Two times a day (BID) | RECTAL | Status: DC

## 2012-10-24 NOTE — Patient Instructions (Signed)

## 2012-10-24 NOTE — Progress Notes (Signed)
  Subjective:     [name] is a 36 y.o. female who presents for a postpartum visit. She is 6 weeks postpartum following a spontaneous vaginal delivery. I have fully reviewed the prenatal and intrapartum course. The delivery was at 41 gestational weeks.  Anesthesia: epidural. Postpartum course has been  uncomplicated. Baby's course has been uncomplicated. Baby is feeding by breast. Bleeding : having menstrual period. Bowel function is normal. Bladder function is normal. Patient is sexually active. Contraception method is condoms. Postpartum depression screening: negative.  The following portions of the patient's history were reviewed and updated as appropriate: allergies, current medications, past family history, past medical history, past social history, past surgical history and problem list.  Review of Systems Pertinent items are noted in HPI.   Objective:    BP 121/89  Pulse 73  Ht 5' 2.99" (1.6 m)  Wt 142 lb (64.411 kg)  BMI 25.16 kg/m2  LMP 10/18/2012  Breastfeeding? Yes  General:  alert and no distress   Breasts:  inspection negative, no nipple discharge or bleeding, no masses or nodularity palpable  Lungs: clear to auscultation bilaterally  Heart:  regular rate and rhythm  Abdomen: soft, non-tender; bowel sounds normal; no masses,  no organomegaly   Vulva:  normal  Vagina: normal vagina  Cervix:  normal  Corpus: normal  Adnexa:  normal adnexa  Rectal Exam:  External hemorrhoids visible, no thrombosis        Assessment:    Normal postpartum exam. Pap smear not done at today's visit.   Plan:    1. Contraception: condoms 2. Anusol-HC prescribed for hemorrhoids 3. Follow up as needed, next pap due after 01/22/2013

## 2012-11-10 IMAGING — US US OB LIMITED
1 series · 14 of 26 positions shown · non-contrast
Comparison: none

CLINICAL DATA: Premature rupture of membranes.  Leaking fluid.

[Series 1: us ob follow up · 14 of 26 slices shown]
[im 1/26]
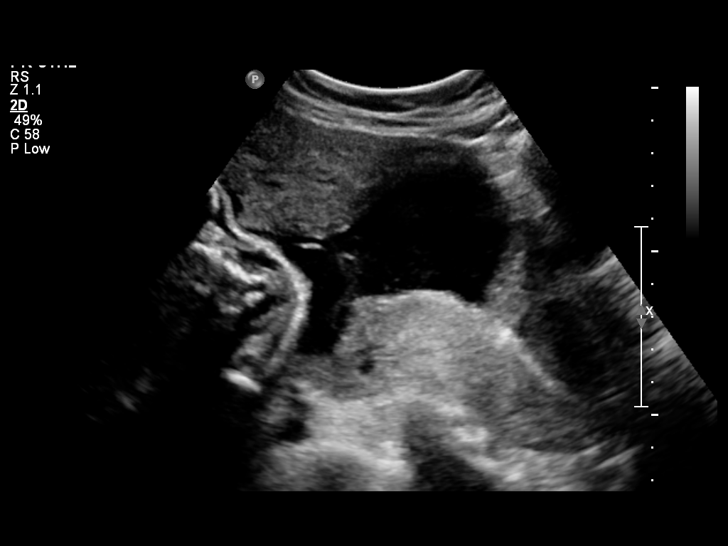
[im 3/26]
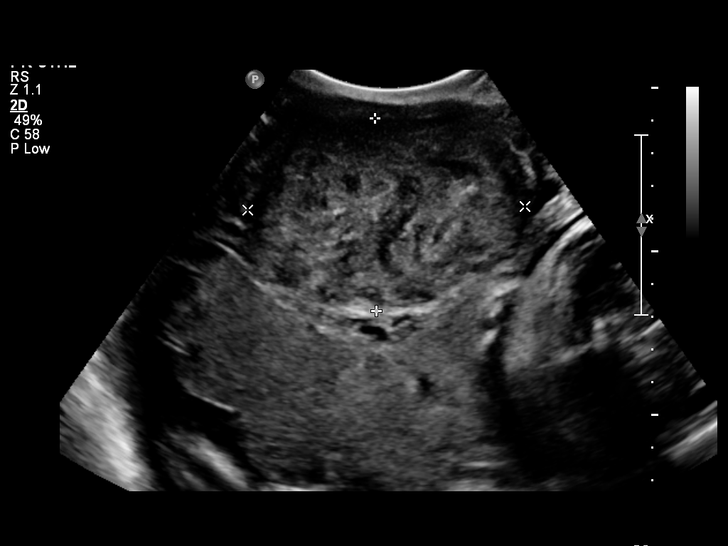
[im 5/26]
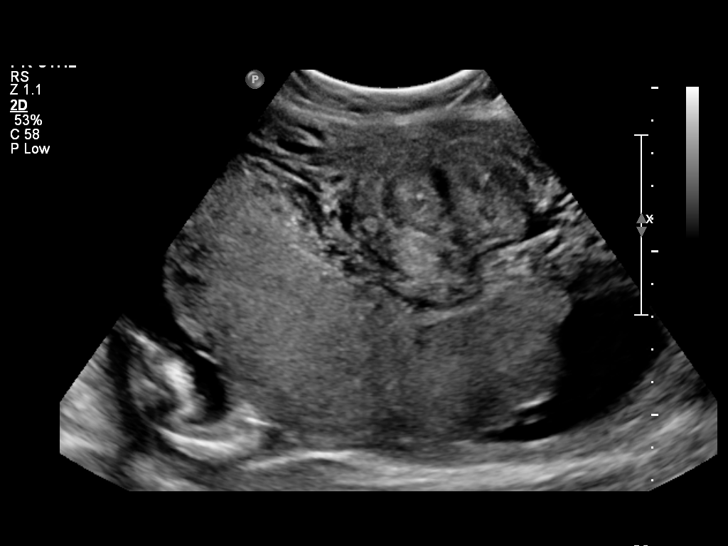
[im 7/26]
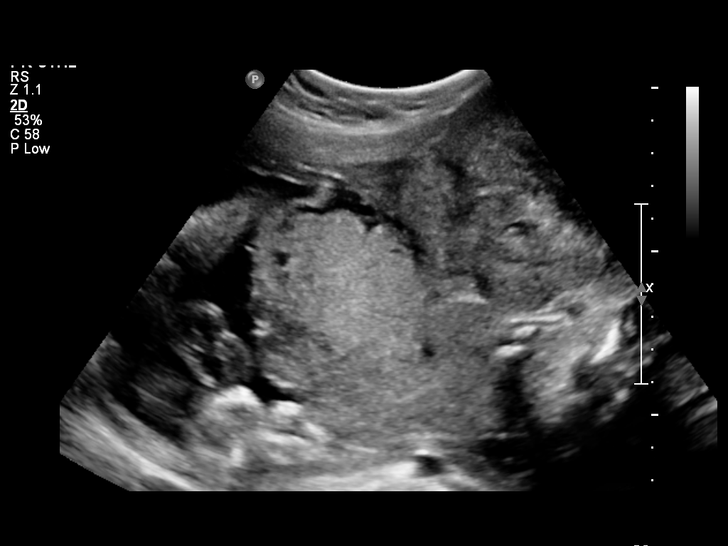
[im 9/26]
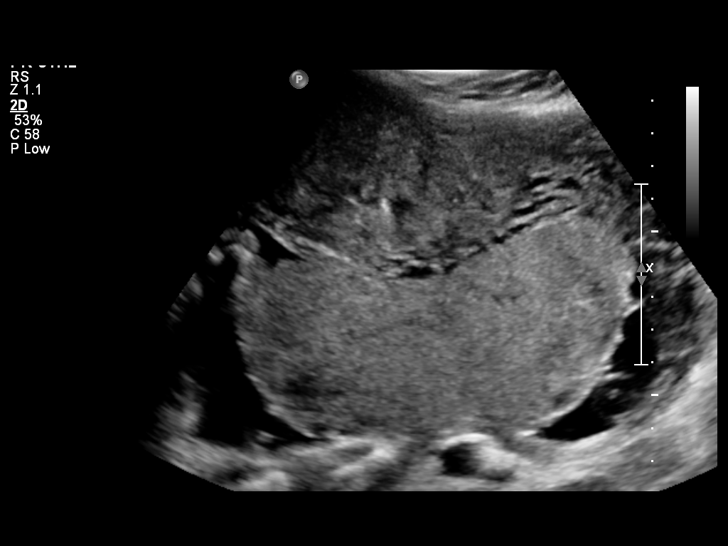
[im 11/26]
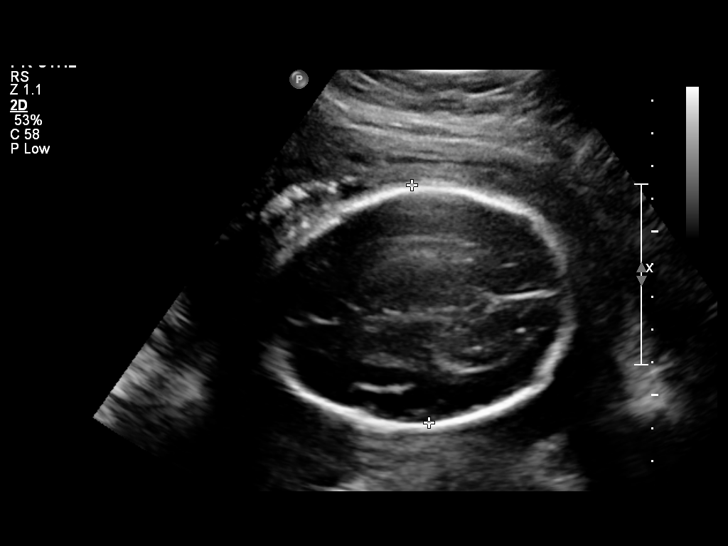
[im 13/26]
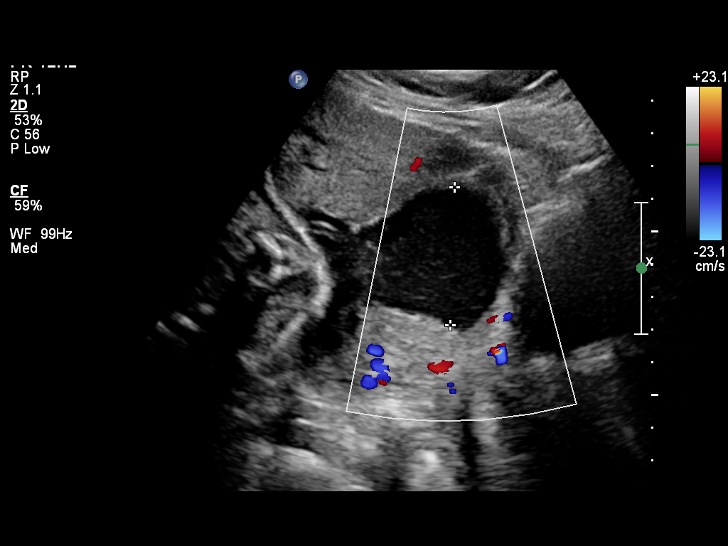
[im 14/26]
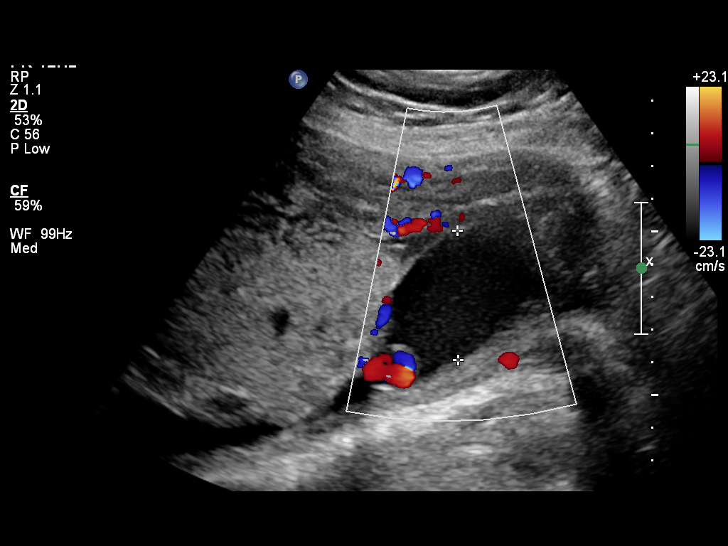
[im 16/26]
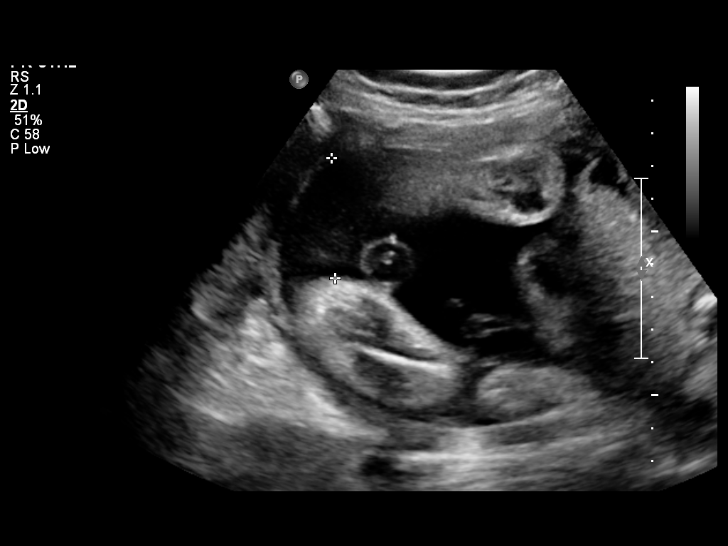
[im 18/26]
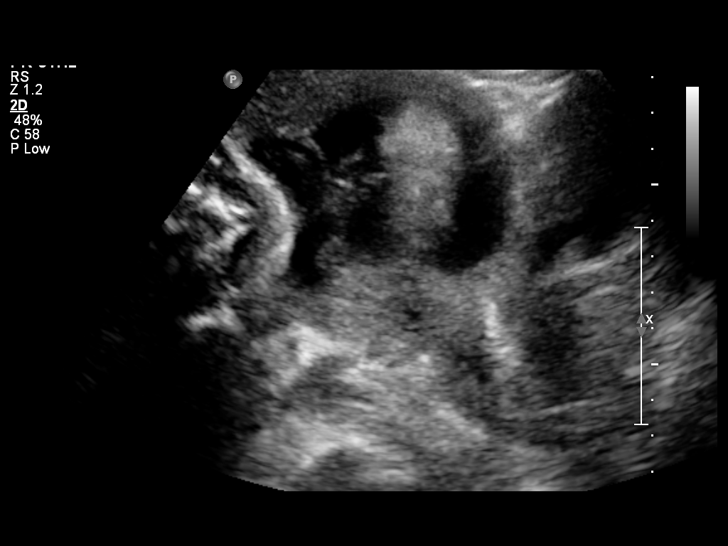
[im 20/26]
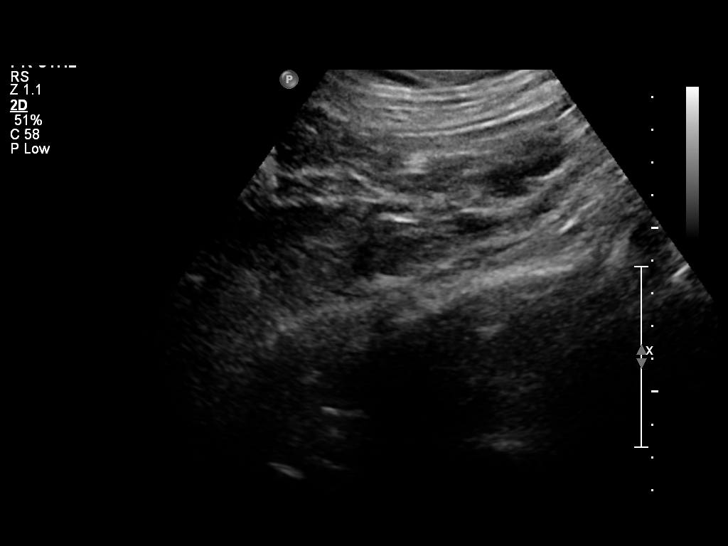
[im 22/26]
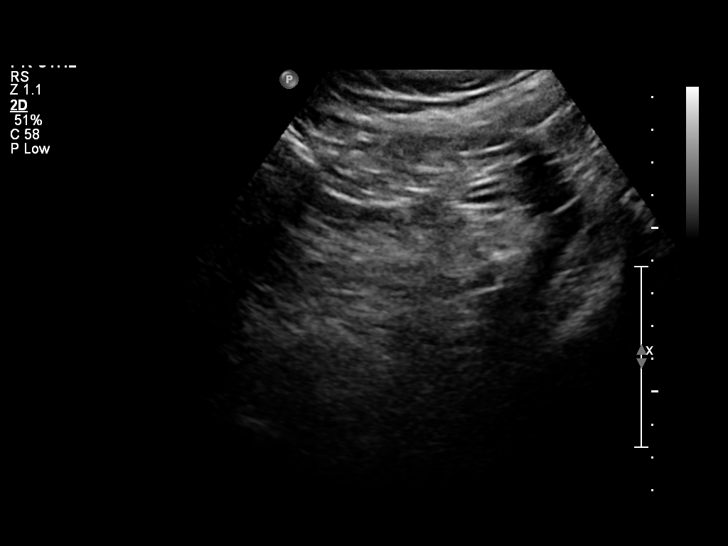
[im 24/26]
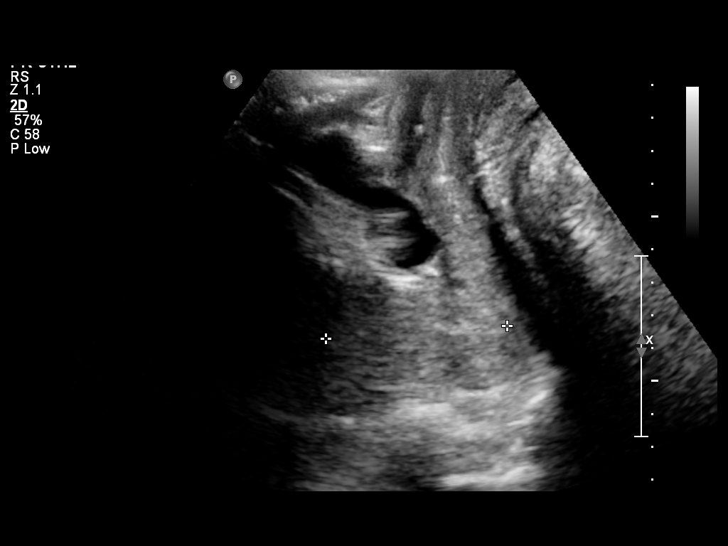
[im 26/26]
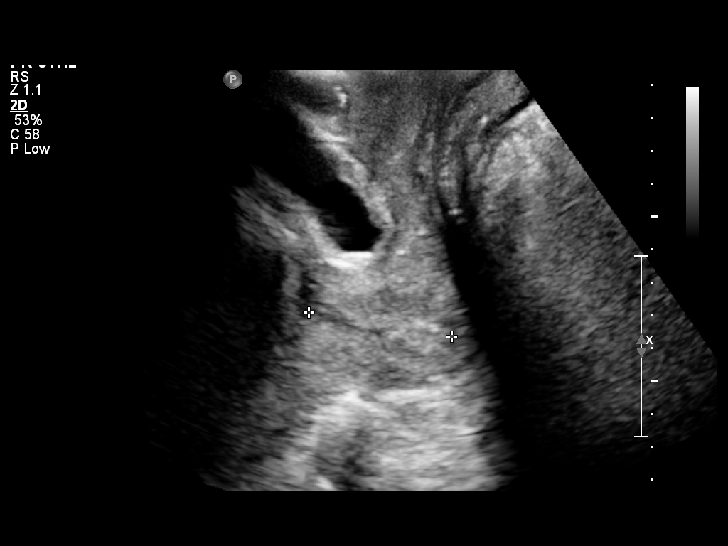

[14 of 26 positions shown; findings below may reference images not displayed]

LIMITED OBSTETRIC ULTRASOUND

Number of Fetuses: 1
Heart Rate: 144 bpm
Movement: Yes
Presentation: Cephalic
Placental Location: Anterior
Previa: No
Amniotic Fluid (Subjective): No within normal limits

AFI: 16.4 cm (5%ile 9.4 cm, 95%ile 22.8 cm)

BPD: 7.3cm   29w    1d

MATERNAL FINDINGS:
Cervix: 4.6 cm by translabial scanning.
Uterus/Adnexae: A large retroplacental fibroid is seen in the
anterior uterine corpus measuring 9.6 x 5.9 x 8.4 cm.
IMPRESSION: 1.  Single living intrauterine fetus in cephalic presentation.
2.  Amniotic fluid volume within normal limits.
3.  Normal cervical length.
4.  Large anterior retroplacental fibroid measuring 9.6 cm.

Recommend followup with non-emergent complete OB 14+ wk US
examination for fetal biometric evaluation and anatomic survey if
not already performed.

## 2013-02-10 ENCOUNTER — Telehealth: Payer: Self-pay | Admitting: Family Medicine

## 2013-02-10 NOTE — Telephone Encounter (Signed)
Patient calls regarding upcoming plans to travel abroad-she leaves 03/03/13 and expects to be gone 2.5 to 3 months.  She is wanting coverage for this time period for her Levothyroxine taken daily.  Patient states that she has 1 pill left, but was unclear if she had refills to cover her in the short term until the extended prescription is given.  RN contacted Nicolette Bang in [place]-patients drug store at [phone] -Lorin Picket verified that she had 2 refills left and that he will refill it for the short term.  He states that with a prescription for a 90 day supply he can fill for cash with the cost to be patient being $10.00.  He also was obligated to disclose to provider that supply for this medication has changed from [facility] to [facility].  He needs the provider approval.  Please follow up with patient regarding short term refill being ready and if provider is willing to give a 90 day supply for travel and her cost.  Also need to notify pharmacy if the supplier change approved by Dr. Ermalene Searing.

## 2013-02-11 ENCOUNTER — Telehealth: Payer: Self-pay | Admitting: Family Medicine

## 2013-02-11 MED ORDER — LEVOTHYROXINE SODIUM 75 MCG PO TABS: 75.0000 ug | ORAL_TABLET | Freq: Every day | ORAL | Status: DC

## 2013-02-11 NOTE — Telephone Encounter (Signed)
Patient was calling to inform that she is leaving April 14th to go to [place] from 2 [place]/2 months and will need enough thyroid medication to cover.  Informed patient that a prescription for her thyroid medication was called in to the pharmacy today for 90 tablets with 3 refills and that she should have enough medication to cover her trip.

## 2013-02-11 NOTE — Telephone Encounter (Signed)
Okay to refill as requested but she needs to come in for TSH if one not checked by OB postpartum. Okay supplier change.

## 2013-02-11 NOTE — Telephone Encounter (Signed)
Patient will come after trip to [facility]

## 2013-02-13 NOTE — Telephone Encounter (Signed)
Called 2nd time regarding difficulty with Levothyroxine refill.  Is leaving for [place]; will be out of the county for 2.5-3 months.  Is out of thyroid medication for past 3 days.  [facility] did not have RX for pick up when husband went to pharmacy.  Rn called [facility] at [phone]; spoke with Lenise Herald who reported Levothryoxine was filled for 30 days today, 02/13/13 using last refill from 02/18/12 prescription; new RX for 90 days ordered 02/11/13 was put on hold.  Explained that patient is going out of the country for extended trip and needs to pick up the 90 day Levothyroxine Rx ordered 02/11/13 by Dr Ermalene Searing. Crystal said she would put the 30 day Rx on hold and fill the 90 day prescription now.  Caller informed of arrangement.

## 2013-09-25 ENCOUNTER — Other Ambulatory Visit: Payer: Self-pay

## 2014-09-21 ENCOUNTER — Encounter: Payer: Self-pay | Admitting: Obstetrics & Gynecology

## 2015-02-20 ENCOUNTER — Ambulatory Visit
Admit: 2015-02-20 | Disposition: A | Discharge status: Home / Self Care | Payer: Self-pay | Attending: Internal Medicine | Admitting: Internal Medicine

## 2015-02-20 LAB — GC/CHLAMYDIA PROBE AMP

## 2015-02-20 LAB — URINALYSIS, COMPLETE
Bilirubin,UR: NEGATIVE
Glucose,UR: NEGATIVE
Ketone: NEGATIVE
NITRITE: NEGATIVE
PROTEIN: NEGATIVE
Ph: 7 (ref 5.0–8.0)
SPECIFIC GRAVITY: 1.015 (ref 1.000–1.030)

## 2015-02-20 LAB — WET PREP, GENITAL

## 2015-02-22 LAB — URINE CULTURE

## 2015-05-04 ENCOUNTER — Encounter: Payer: Self-pay | Admitting: Family Medicine

## 2015-05-04 ENCOUNTER — Ambulatory Visit (INDEPENDENT_AMBULATORY_CARE_PROVIDER_SITE_OTHER): Payer: 59 | Admitting: Family Medicine

## 2015-05-04 VITALS — BP 100/60 | HR 88 | Ht 62.0 in | Wt 139.0 lb

## 2015-05-04 DIAGNOSIS — E039 Hypothyroidism, unspecified: Secondary | ICD-10-CM

## 2015-05-04 MED ORDER — LEVOTHYROXINE SODIUM 75 MCG PO TABS: 75.0000 ug | ORAL_TABLET | Freq: Every day | ORAL | Status: DC

## 2015-05-04 NOTE — Progress Notes (Signed)
Name: [name]   MRN: [id number]    DOB: 1976-07-23   Date:05/04/2015       Progress Note  Subjective  Chief Complaint  Chief Complaint  Patient presents with  . Hypothyroidism    Thyroid Problem Presents for follow-up visit. Symptoms include hoarse voice. Patient reports no anxiety, cold intolerance, constipation, depressed mood, diaphoresis, diarrhea, dry skin, fatigue, hair loss, heat intolerance, leg swelling, menstrual problem, nail problem, palpitations, tremors, visual change, weight gain or weight loss. The symptoms have been stable. Past treatments include levothyroxine. The treatment provided moderate relief. There is no history of atrial fibrillation, dementia, diabetes, Graves' ophthalmopathy, heart failure, hyperlipidemia, neuropathy or obesity. There are no known risk factors.    No problem-specific assessment & plan notes found for this encounter.   Past Medical History  Diagnosis Date  . Preterm labor     Past Surgical History  Procedure Laterality Date  . Hemrrhoidectomy      age 34 or 54    Family History  Problem Relation Age of Onset  . Stroke Father   . Other Neg Hx     History   Social History  . Marital Status: Married    Spouse Name: N/A  . Number of Children: N/A  . Years of Education: N/A   Occupational History  . Not on file.   Social History Main Topics  . Smoking status: Never Smoker   . Smokeless tobacco: Never Used  . Alcohol Use: No  . Drug Use: No  . Sexual Activity:    Partners: Male    Birth Control/ Protection: Condom   Other Topics Concern  . Not on file   Social History Narrative    No Known Allergies   Review of Systems  Constitutional: Negative for fever, chills, weight loss, weight gain, malaise/fatigue, diaphoresis and fatigue.  HENT: Positive for hoarse voice. Negative for ear discharge, ear pain and sore throat.   Eyes: Negative for blurred vision.  Respiratory: Negative for cough, sputum production,  shortness of breath and wheezing.   Cardiovascular: Negative for chest pain, palpitations and leg swelling.  Gastrointestinal: Negative for heartburn, nausea, abdominal pain, diarrhea, constipation, blood in stool and melena.  Genitourinary: Negative for dysuria, urgency, frequency, hematuria and menstrual problem.  Musculoskeletal: Negative for myalgias, back pain, joint pain and neck pain.  Skin: Negative for rash.  Neurological: Negative for dizziness, tingling, tremors, sensory change, focal weakness and headaches.  Endo/Heme/Allergies: Negative for environmental allergies, cold intolerance, heat intolerance and polydipsia. Does not bruise/bleed easily.  Psychiatric/Behavioral: Negative for depression and suicidal ideas. The patient is not nervous/anxious and does not have insomnia.      Objective  Filed Vitals:   05/04/15 1504  BP: 100/60  Pulse: 88  Height: 5\' 2"  (1.575 m)  Weight: 139 lb (63.05 kg)    Physical Exam  Constitutional: She is well-developed, well-nourished, and in no distress. No distress.  HENT:  Head: Normocephalic and atraumatic.  Right Ear: External ear normal.  Left Ear: External ear normal.  Nose: Nose normal.  Mouth/Throat: Oropharynx is clear and moist.  Eyes: Conjunctivae and EOM are normal. Pupils are equal, round, and reactive to light. Right eye exhibits no discharge. Left eye exhibits no discharge.  Neck: Normal range of motion. Neck supple. No JVD present. No thyromegaly present.  Cardiovascular: Normal rate, regular rhythm, normal heart sounds and intact distal pulses.  Exam reveals no gallop and no friction rub.   No murmur heard. Pulmonary/Chest: Effort normal and  breath sounds normal.  Abdominal: Soft. Bowel sounds are normal. She exhibits no mass. There is no tenderness. There is no guarding.  Musculoskeletal: Normal range of motion. She exhibits no edema.  Lymphadenopathy:    She has no cervical adenopathy.  Neurological: She is alert.  She has normal reflexes.  Skin: Skin is warm and dry. She is not diaphoretic.  Psychiatric: Mood and affect normal.      Recent Results (from the past 2160 hour(s))  Urinalysis, Complete     Status: None   Collection Time: 02/20/15  8:59 AM  Result Value Ref Range   Color - urine YELLOW    Clarity - urine CLEAR    Glucose,UR NEGATIVE NEGATIVE   Bilirubin,UR NEGATIVE NEGATIVE   Ketone NEGATIVE NEGATIVE   Specific Gravity 1.015 1.000-1.030   Blood SMALL NEGATIVE   Ph 7.0 5.0-8.0   Protein NEGATIVE NEGATIVE   Nitrite NEGATIVE NEGATIVE   Leukocyte Esterase SMALL NEGATIVE   RBC,UR 5-15 0-5 /HPF   WBC UR 0-5 0-5 /HPF   Bacteria 1+ (TRACE/FEW) NONE SEEN   Squamous Epithelial 0-5 / HPF   Urine culture     Status: None   Collection Time: 02/20/15  8:59 AM  Result Value Ref Range   Micro Text Report         SOURCE: CLEAN CATCH    COMMENT                   NO GROWTH IN 36 HOURS   ANTIBIOTIC                                                      Wet prep, genital     Status: None   Collection Time: 02/20/15 10:05 AM  Result Value Ref Range   Micro Text Report         SOURCE: swab vag    COMMENT                   NO TRICHOMONAS,SPERMATOZOA,YEAST,OR CLUE CELLS SEEN   COMMENT                   MANY WHITE BLOOD CELLS SEEN   ANTIBIOTIC                                                      GC/Chlamydia Probe Amp     Status: None   Collection Time: 02/20/15 10:05 AM  Result Value Ref Range   Micro Text Report         SOURCE: swab    CHLAMYDIA                 CHLAMYDIA TRACHOMATIS NEGATIVE   N.GONORRHOEAE             N.GONORRHOEAE NEGATIVE   ANTIBIOTIC                                                         Assessment & Plan  Problem List  Items Addressed This Visit      Endocrine   Hypothyroidism - Primary   Relevant Medications   levothyroxine (SYNTHROID, LEVOTHROID) 75 MCG tablet   Other Relevant Orders   TSH        Dr. Otilio Miu [facility]  05/04/2015

## 2015-05-05 LAB — TSH: TSH: 3.05 u[IU]/mL (ref 0.450–4.500)

## 2015-05-09 ENCOUNTER — Emergency Department
Admission: EM | Admit: 2015-05-09 | Discharge: 2015-05-09 | Disposition: A | Discharge status: Home / Self Care | Payer: 59 | Attending: Emergency Medicine | Admitting: Emergency Medicine

## 2015-05-09 ENCOUNTER — Encounter: Payer: Self-pay | Admitting: *Deleted

## 2015-05-09 ENCOUNTER — Emergency Department: Payer: 59

## 2015-05-09 DIAGNOSIS — N23 Unspecified renal colic: Secondary | ICD-10-CM | POA: Diagnosis not present

## 2015-05-09 DIAGNOSIS — Z3202 Encounter for pregnancy test, result negative: Secondary | ICD-10-CM | POA: Insufficient documentation

## 2015-05-09 DIAGNOSIS — R109 Unspecified abdominal pain: Secondary | ICD-10-CM | POA: Diagnosis present

## 2015-05-09 HISTORY — DX: Calculus of kidney: N20.0

## 2015-05-09 HISTORY — DX: Disorder of thyroid, unspecified: E07.9

## 2015-05-09 LAB — CBC WITH DIFFERENTIAL/PLATELET
Basophils Absolute: 0 10*3/uL (ref 0–0.1)
Basophils Relative: 0 %
EOS ABS: 0 10*3/uL (ref 0–0.7)
Eosinophils Relative: 0 %
HCT: 31.9 % — ABNORMAL LOW (ref 35.0–47.0)
HEMOGLOBIN: 10 g/dL — AB (ref 12.0–16.0)
Lymphocytes Relative: 12 %
Lymphs Abs: 1 10*3/uL (ref 1.0–3.6)
MCH: 23.6 pg — ABNORMAL LOW (ref 26.0–34.0)
MCHC: 31.4 g/dL — ABNORMAL LOW (ref 32.0–36.0)
MCV: 75 fL — AB (ref 80.0–100.0)
MONOS PCT: 5 %
Monocytes Absolute: 0.4 10*3/uL (ref 0.2–0.9)
NEUTROS PCT: 83 %
Neutro Abs: 7.2 10*3/uL — ABNORMAL HIGH (ref 1.4–6.5)
Platelets: 243 10*3/uL (ref 150–440)
RBC: 4.25 MIL/uL (ref 3.80–5.20)
RDW: 16 % — ABNORMAL HIGH (ref 11.5–14.5)
WBC: 8.7 10*3/uL (ref 3.6–11.0)

## 2015-05-09 LAB — URINALYSIS COMPLETE WITH MICROSCOPIC (ARMC ONLY)
Bilirubin Urine: NEGATIVE
Glucose, UA: NEGATIVE mg/dL
Ketones, ur: NEGATIVE mg/dL
Nitrite: NEGATIVE
PH: 7 (ref 5.0–8.0)
Protein, ur: NEGATIVE mg/dL
Specific Gravity, Urine: 1.012 (ref 1.005–1.030)

## 2015-05-09 LAB — BASIC METABOLIC PANEL
Anion gap: 5 (ref 5–15)
BUN: 13 mg/dL (ref 6–20)
CHLORIDE: 102 mmol/L (ref 101–111)
CO2: 26 mmol/L (ref 22–32)
CREATININE: 0.54 mg/dL (ref 0.44–1.00)
Calcium: 8.5 mg/dL — ABNORMAL LOW (ref 8.9–10.3)
GFR calc Af Amer: >60 mL/min (ref >60)
GFR calc non Af Amer: >60 mL/min (ref >60)
Glucose, Bld: 110 mg/dL — ABNORMAL HIGH (ref 65–99)
Potassium: 3.6 mmol/L (ref 3.5–5.1)
Sodium: 133 mmol/L — ABNORMAL LOW (ref 135–145)

## 2015-05-09 LAB — POCT PREGNANCY, URINE: PREG TEST UR: NEGATIVE

## 2015-05-09 MED ORDER — OXYCODONE-ACETAMINOPHEN 5-325 MG PO TABS
2.0000 | ORAL_TABLET | Freq: Once | ORAL | Status: AC
  Administered 2015-05-09: 2 via ORAL

## 2015-05-09 MED ORDER — CIPROFLOXACIN HCL 500 MG PO TABS: 500.0000 mg | ORAL_TABLET | Freq: Two times a day (BID) | ORAL | Status: AC

## 2015-05-09 MED ORDER — OXYCODONE-ACETAMINOPHEN 5-325 MG PO TABS
ORAL_TABLET | ORAL | Status: AC
  Administered 2015-05-09: 2 via ORAL
  Filled 2015-05-09: qty 2

## 2015-05-09 MED ORDER — OXYCODONE-ACETAMINOPHEN 5-325 MG PO TABS: 2.0000 | ORAL_TABLET | Freq: Once | ORAL | Status: DC

## 2015-05-09 MED ORDER — CIPROFLOXACIN HCL 500 MG PO TABS
ORAL_TABLET | ORAL | Status: AC
  Administered 2015-05-09: 500 mg via ORAL
  Filled 2015-05-09: qty 1

## 2015-05-09 MED ORDER — CIPROFLOXACIN HCL 500 MG PO TABS
500.0000 mg | ORAL_TABLET | Freq: Once | ORAL | Status: AC
  Administered 2015-05-09: 500 mg via ORAL

## 2015-05-09 MED ORDER — TAMSULOSIN HCL 0.4 MG PO CAPS: 0.4000 mg | ORAL_CAPSULE | Freq: Every day | ORAL | Status: DC

## 2015-05-09 NOTE — ED Notes (Signed)
Lower abdominal pain which began 2 days ago.  Pt has lower back pain and right flank pain with this.  No vomiting with this.

## 2015-05-09 NOTE — ED Provider Notes (Signed)
[facility] Emergency Department Provider Note     Time seen: ----------------------------------------- 6:45 PM on 05/09/2015 -----------------------------------------    I have reviewed the triage vital signs and the nursing notes.   HISTORY  Chief Complaint Abdominal Pain    HPI [name] is a 39 y.o. female who presents ER with lower abdominal pain which began 2 days ago. Started suprapubically notes in her right lower back. 76 significant flank pain, no significant nausea vomiting or diarrhea. No fever. Denies history of same. Pain is currently mild to moderate in the right flank, nothing makes it better or worse.   Past Medical History  Diagnosis Date  . Preterm labor   . Kidney stone   . Thyroid disease     Patient Active Problem List   Diagnosis Date Noted  . GBS (group B Streptococcus carrier), +RV culture, currently pregnant 08/13/2012  . Hemorrhoids complicating pregnancy or puerperium with antenatal complication 99/24/2683  . Marginal insertion of umbilical cord 41/96/2229  . Supervision of high-risk pregnancy 01/16/2012  . Uterine fibroids affecting pregnancy, antepartum 01/16/2012  . Maternal hypothyroidism, antepartum 01/16/2012  . Hypothyroidism 12/03/2010  . ALLERGIC RHINITIS 07/27/2009  . ANEMIA-IRON DEFICIENCY 05/06/2009  . 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 EXTERNAL HEMORRHOIDS 05/06/2009    Past Surgical History  Procedure Laterality Date  . Hemrrhoidectomy      age 49 or 12    Allergies Review of patient's allergies indicates no known allergies.  Social History History  Substance Use Topics  . Smoking status: Never Smoker   . Smokeless tobacco: Never Used  . Alcohol Use: No    Review of Systems Constitutional: Negative for fever. Eyes: Negative for visual changes. ENT: Negative for sore throat. Cardiovascular: Negative for chest pain. Respiratory: Negative for shortness of breath. Gastrointestinal:  Negative for abdominal pain, vomiting and diarrhea. Genitourinary: Positive for recent dysuria Musculoskeletal: Positive for right flank and low back pain Skin: Negative for rash. Neurological: Negative for headaches, focal weakness or numbness.  10-point ROS otherwise negative.  ____________________________________________   PHYSICAL EXAM:  VITAL SIGNS: ED Triage Vitals  Enc Vitals Group     BP 05/09/15 1732 124/80 mmHg     Pulse Rate 05/09/15 1732 69     Resp 05/09/15 1732 20     Temp 05/09/15 1732 98.4 F (36.9 C)     Temp Source 05/09/15 1732 Oral     SpO2 05/09/15 1732 100 %     Weight 05/09/15 1732 139 lb (63.05 kg)     Height --      Head Cir --      Peak Flow --      Pain Score 05/09/15 1733 6     Pain Loc --      Pain Edu? --      Excl. in [place]? --     Constitutional: Alert and oriented. Well appearing and in no distress. Eyes: Conjunctivae are normal. PERRL. Normal extraocular movements. ENT   Head: Normocephalic and atraumatic.   Nose: No congestion/rhinnorhea.   Mouth/Throat: Mucous membranes are moist.   Neck: No stridor. Hematological/Lymphatic/Immunilogical: No cervical lymphadenopathy. Cardiovascular: Normal rate, regular rhythm. Normal and symmetric distal pulses are present in all extremities. No murmurs, rubs, or gallops. Respiratory: Normal respiratory effort without tachypnea nor retractions. Breath sounds are clear and equal bilaterally. No wheezes/rales/rhonchi. Gastrointestinal: Soft and nontender. No distention. No abdominal bruits. There is CVA tenderness on the right Musculoskeletal: Nontender with normal range of motion in all extremities. No  joint effusions.  No lower extremity tenderness nor edema. Neurologic:  Normal speech and language. No gross focal neurologic deficits are appreciated. Speech is normal. No gait instability. Skin:  Skin is warm, dry and intact. No rash noted. Psychiatric: Mood and affect are normal. Speech and  behavior are normal. Patient exhibits appropriate insight and judgment. ____________________________________________  ED COURSE:  Pertinent labs & imaging results that were available during my care of the patient were reviewed by me and considered in my medical decision making (see chart for details). Patient will need labs and CT scan. Likely renal colic ____________________________________________    LABS (pertinent positives/negatives)  Labs Reviewed  URINALYSIS COMPLETEWITH MICROSCOPIC ([facility] ONLY) - Abnormal; Notable for the following:    Color, Urine YELLOW (*)    APPearance HAZY (*)    Hgb urine dipstick 2+ (*)    Leukocytes, UA 3+ (*)    Bacteria, UA RARE (*)    Squamous Epithelial / LPF 0-5 (*)    All other components within normal limits  CBC WITH DIFFERENTIAL/PLATELET - Abnormal; Notable for the following:    Hemoglobin 10.0 (*)    HCT 31.9 (*)    MCV 75.0 (*)    MCH 23.6 (*)    MCHC 31.4 (*)    RDW 16.0 (*)    Neutro Abs 7.2 (*)    All other components within normal limits  BASIC METABOLIC PANEL - Abnormal; Notable for the following:    Sodium 133 (*)    Glucose, Bld 110 (*)    Calcium 8.5 (*)    All other components within normal limits  POC URINE PREG, ED  POCT PREGNANCY, URINE    RADIOLOGY  IMPRESSION: Severe hydronephrosis on the right due to a 5 mm calculus in the distal right ureter.  Multiple small intrarenal calculi bilaterally.  Diffusely enlarged uterus with what appears to be a dominant leiomyoma in the anterior uterus region. Fundal leiomyoma has been documented on prior obstetrical ultrasound.  Appendix appears normal. No bowel obstruction. No abscess.  ____________________________________________  FINAL ASSESSMENT AND PLAN  Renal colic  Plan: Patient with normal creatinine, we'll cover with infection with Cipro. 5 mm stone should be able to be passed. We'll have close follow-up with urology for reevaluation.   Earleen Newport, MD   Earleen Newport, MD 05/09/15 2019

## 2015-05-09 NOTE — Discharge Instructions (Signed)

## 2015-05-10 ENCOUNTER — Telehealth: Payer: Self-pay | Admitting: Emergency Medicine

## 2015-05-10 NOTE — ED Notes (Signed)
[facility] called to clarify frequency for med percocet.  Per dr Corky Downs order 1-2 tablets every 6 hours as needed for pain.

## 2015-05-31 ENCOUNTER — Ambulatory Visit (INDEPENDENT_AMBULATORY_CARE_PROVIDER_SITE_OTHER): Payer: 59 | Admitting: Urology

## 2015-05-31 VITALS — BP 112/76 | HR 71 | Resp 18 | Ht 62.0 in | Wt 140.6 lb

## 2015-05-31 DIAGNOSIS — D252 Subserosal leiomyoma of uterus: Secondary | ICD-10-CM

## 2015-05-31 DIAGNOSIS — N200 Calculus of kidney: Secondary | ICD-10-CM

## 2015-05-31 DIAGNOSIS — N201 Calculus of ureter: Secondary | ICD-10-CM | POA: Diagnosis not present

## 2015-05-31 DIAGNOSIS — D259 Leiomyoma of uterus, unspecified: Secondary | ICD-10-CM | POA: Insufficient documentation

## 2015-05-31 LAB — URINALYSIS, COMPLETE
Bilirubin, UA: NEGATIVE
Glucose, UA: NEGATIVE
KETONES UA: NEGATIVE
LEUKOCYTES UA: NEGATIVE
Nitrite, UA: NEGATIVE
PROTEIN UA: NEGATIVE
SPEC GRAV UA: 1.015 (ref 1.005–1.030)
UUROB: 0.2 mg/dL (ref 0.2–1.0)
pH, UA: 7.5 (ref 5.0–7.5)

## 2015-05-31 LAB — MICROSCOPIC EXAMINATION: Bacteria, UA: NONE SEEN

## 2015-05-31 NOTE — Progress Notes (Signed)
Patient ID: Lucretia Field, female   DOB: 06-11-1976, 39 y.o.   MRN: [id number]  Subjective: Yetzali is a 39 yo female who had the onset on 6/19 of right back pain that got more severe and radiated into the RLQ.   She had no hematuria or voiding symptoms.   She was given tamsulosin and pain med and her pain improved and she now has mild intermittent pain.   She has small bilateral renal stones as well.   She had the stones diagnosed about 3 years ago in [place].  She has had no GU surgery.  She has a large uterine fibroid that fills the right pelvis.   She has had some hematuria and pain a year ago and was treated for a UTI.  Her UA has 3-10 RBC's today.  CT in the ER showed a 35mm right distal stone with hydro and small bilateral renal stones. .  ROS:  Review of Systems  All other systems reviewed and are negative.   No Known Allergies  Outpatient Encounter Prescriptions as of 05/31/2015  Medication Sig  . calcium carbonate (OS-CAL - DOSED IN MG OF ELEMENTAL CALCIUM) 1250 (500 CA) MG tablet Take 2 tablets by mouth daily.  Marland Kitchen levothyroxine (SYNTHROID, LEVOTHROID) 75 MCG tablet Take 1 tablet (75 mcg total) by mouth daily.  . Prenatal Vit-Fe Fumarate-FA (PRENATAL MULTIVITAMIN) TABS Take 1 tablet by mouth daily.  . tamsulosin (FLOMAX) 0.4 MG CAPS capsule Take 1 capsule (0.4 mg total) by mouth daily after breakfast.  . vitamin C (ASCORBIC ACID) 500 MG tablet Take 500 mg by mouth daily as needed (for low energy.).  Marland Kitchen oxyCODONE-acetaminophen (PERCOCET/ROXICET) 5-325 MG per tablet Take 2 tablets by mouth once. (Patient not taking: Reported on 05/31/2015)   No facility-administered encounter medications on file as of 05/31/2015.    Past Medical History  Diagnosis Date  . Preterm labor   . Kidney stone   . Thyroid disease     Past Surgical History  Procedure Laterality Date  . Hemrrhoidectomy      age 72 or 69    History   Social History  . Marital Status: Married    Spouse Name: N/A  . Number of  Children: N/A  . Years of Education: N/A   Occupational History  . Not on file.   Social History Main Topics  . Smoking status: Never Smoker   . Smokeless tobacco: Never Used  . Alcohol Use: No  . Drug Use: No  . Sexual Activity:    Partners: Male    Birth Control/ Protection: Condom   Other Topics Concern  . Not on file   Social History Narrative    Family History  Problem Relation Age of Onset  . Stroke Father   . Other Neg Hx        Objective: Filed Vitals:   05/31/15 1600  BP: 112/76  Pulse: 71  Resp: 18     Physical Exam  Constitutional: She is oriented to person, place, and time and well-developed, well-nourished, and in no distress.  HENT:  Head: Normocephalic and atraumatic.  Neck: Normal range of motion. Neck supple.  Cardiovascular: Normal rate, regular rhythm and normal heart sounds.   Pulmonary/Chest: Effort normal and breath sounds normal. No respiratory distress.  Abdominal: Soft. She exhibits no mass. There is no tenderness.  Musculoskeletal: Normal range of motion. She exhibits no edema or tenderness.  Neurological: She is alert and oriented to person, place, and time.  Skin: Skin is  warm and dry.  Psychiatric: Mood and affect normal.    Lab Results:  No results for input(s): WBC, HGB, HCT, PLT in the last 72 hours. BMET No results for input(s): NA, K, CL, CO2, GLUCOSE, BUN, CREATININE, CALCIUM in the last 72 hours. PT/INR No results for input(s): LABPROT, INR in the last 72 hours. ABG No results for input(s): PHART, HCO3 in the last 72 hours.  Invalid input(s): PCO2, PO2  Urine dipstick shows positive for RBC's.  Micro exam: 0-5 WBC's per HPF and 3-10 RBC's per HPF.  Studies/Results: No results found. CT films and report reviewed.  Assessment: She has a 30mm right distal stone with obstruction but her symptoms have improved.   She still has hematuria. She has bilateral renal stones that are small. She has a large fibroid but is  aware of that.   Plan: Urine strainer given. I will get a renal US and KUB and if they show persistent obstruction and no stone progression, she will need ureteroscopy.  I reviewed the risks of bleeding, infection , ureteral injury, need for stent, thrombotic events, secondary procedures and anesthetic complications. If she has no obstruction, I will have her return in 2 weeks.   CC: Eliezer Lofts, MD       Malka So 05/31/2015

## 2015-06-03 ENCOUNTER — Other Ambulatory Visit: Payer: Self-pay | Admitting: Family Medicine

## 2015-06-03 DIAGNOSIS — N201 Calculus of ureter: Secondary | ICD-10-CM

## 2015-06-16 ENCOUNTER — Ambulatory Visit
Admission: RE | Admit: 2015-06-16 | Discharge: 2015-06-16 | Disposition: A | Discharge status: Home / Self Care | Payer: 59 | Source: Ambulatory Visit | Attending: Urology | Admitting: Urology

## 2015-06-16 DIAGNOSIS — N132 Hydronephrosis with renal and ureteral calculous obstruction: Secondary | ICD-10-CM | POA: Insufficient documentation

## 2015-06-16 DIAGNOSIS — N200 Calculus of kidney: Secondary | ICD-10-CM | POA: Insufficient documentation

## 2015-06-16 DIAGNOSIS — N201 Calculus of ureter: Secondary | ICD-10-CM | POA: Diagnosis present

## 2015-06-28 ENCOUNTER — Ambulatory Visit (INDEPENDENT_AMBULATORY_CARE_PROVIDER_SITE_OTHER): Payer: 59 | Admitting: Urology

## 2015-06-28 ENCOUNTER — Ambulatory Visit
Admission: RE | Admit: 2015-06-28 | Discharge: 2015-06-28 | Disposition: A | Discharge status: Home / Self Care | Payer: 59 | Source: Ambulatory Visit | Attending: Urology | Admitting: Urology

## 2015-06-28 VITALS — BP 126/83 | HR 66 | Ht 62.0 in | Wt 139.9 lb

## 2015-06-28 DIAGNOSIS — N202 Calculus of kidney with calculus of ureter: Secondary | ICD-10-CM | POA: Insufficient documentation

## 2015-06-28 DIAGNOSIS — N201 Calculus of ureter: Secondary | ICD-10-CM

## 2015-06-28 LAB — URINALYSIS, COMPLETE
BILIRUBIN UA: NEGATIVE
GLUCOSE, UA: NEGATIVE
KETONES UA: NEGATIVE
Nitrite, UA: NEGATIVE
Protein, UA: NEGATIVE
Specific Gravity, UA: 1.02 (ref 1.005–1.030)
Urobilinogen, Ur: 0.2 mg/dL (ref 0.2–1.0)
pH, UA: 6.5 (ref 5.0–7.5)

## 2015-06-28 LAB — MICROSCOPIC EXAMINATION: Bacteria, UA: NONE SEEN

## 2015-06-28 NOTE — Assessment & Plan Note (Signed)
She remains symptomatic and the KUB and [place] on 7/27 still show an obstructing distal stone on the right.  I am going to repeat a KUB today and if the stone is still present, I will get her set up for cystoscopy with right retrograde and ureteroscopic stone extraction with possible laser and stenting.   I have reviewed the risks of bleeding, infection, ureteral injury, need for secondary procedures and a stent, thrombotic events and anesthetic risks.

## 2015-06-28 NOTE — Progress Notes (Signed)
06/28/2015 11:30 AM   [name] 1976/04/23 [id number]  Referring provider: Jinny Sanders, MD [address] [place] [address] [place], [place] [address]  Chief Complaint  Patient presents with  . Nephrolithiasis    2wk    HPI: [name] returns today in f/u for her right distal stone.  She had f/u KUB and renal US on 7/27 that showed a persistent right distal stone and hydro.  She continues to have a little bit of pain in the right low back.  She has some frequency but it is with hydration.       PMH: Past Medical History  Diagnosis Date  . Preterm labor   . Kidney stone   . Thyroid disease     Surgical History: Past Surgical History  Procedure Laterality Date  . Hemrrhoidectomy      age 39 or 18    Home Medications:    Medication List       This list is accurate as of: 06/28/15 11:30 AM.  Always use your most recent med list.               levothyroxine 75 MCG tablet  Commonly known as:  SYNTHROID, LEVOTHROID  Take 1 tablet (75 mcg total) by mouth daily.        Allergies: No Known Allergies  Family History: Family History  Problem Relation Age of Onset  . Stroke Father   . Other Neg Hx     Social History:  reports that she has never smoked. She has never used smokeless tobacco. She reports that she does not drink alcohol or use illicit drugs.  ROS: UROLOGY Frequent Urination?: No Hard to postpone urination?: No Burning/pain with urination?: No Get up at night to urinate?: No Leakage of urine?: No Urine stream starts and stops?: No Trouble starting stream?: No Do you have to strain to urinate?: No Blood in urine?: No Urinary tract infection?: No Sexually transmitted disease?: No Injury to kidneys or bladder?: No Painful intercourse?: No Weak stream?: No Currently pregnant?: No Vaginal bleeding?: No Last menstrual period?: 06-24-15  Gastrointestinal Nausea?: No Vomiting?: No Indigestion/heartburn?: No Diarrhea?: No Constipation?:  No  Constitutional Fever: No Night sweats?: No Weight loss?: No Fatigue?: No  Skin Skin rash/lesions?: No Itching?: No  Eyes Blurred vision?: No Double vision?: No  Ears/Nose/Throat Sore throat?: No Sinus problems?: No  Hematologic/Lymphatic Swollen glands?: No Easy bruising?: No  Cardiovascular Leg swelling?: No Chest pain?: No  Respiratory Cough?: No Shortness of breath?: No  Endocrine Excessive thirst?: No  Musculoskeletal Back pain?: Yes Joint pain?: No  Neurological Headaches?: No Dizziness?: No  Psychologic Depression?: No Anxiety?: No  Physical Exam: BP 126/83 mmHg  Pulse 66  Ht 5\' 2"  (1.575 m)  Wt 139 lb 14.4 oz (63.458 kg)  BMI 25.58 kg/m2  LMP 06/25/2015  Breastfeeding? No  Physical Exam  Constitutional: She appears well-developed and well-nourished. No distress.  Cardiovascular: Normal rate, regular rhythm and normal heart sounds.   Pulmonary/Chest: Effort normal and breath sounds normal. No respiratory distress.  Abdominal:  No CVAT noted     Laboratory Data: Lab Results  Component Value Date   WBC 8.7 05/09/2015   HGB 10.0* 05/09/2015   HCT 31.9* 05/09/2015   MCV 75.0* 05/09/2015   PLT 243 05/09/2015    Lab Results  Component Value Date   CREATININE 0.54 05/09/2015    No results found for: PSA  No results found for: TESTOSTERONE  No results found for: HGBA1C  Urinalysis    Component Value Date/Time   COLORURINE YELLOW* 05/09/2015 1737   COLORURINE YELLOW 02/20/2015 0859   APPEARANCEUR HAZY* 05/09/2015 1737   APPEARANCEUR CLEAR 02/20/2015 0859   LABSPEC 1.012 05/09/2015 1737   LABSPEC 1.015 02/20/2015 0859   PHURINE 7.0 05/09/2015 1737   PHURINE 7.0 02/20/2015 0859   GLUCOSEU Negative 05/31/2015 1556   GLUCOSEU NEGATIVE 02/20/2015 0859   HGBUR 2+* 05/09/2015 1737   HGBUR SMALL 02/20/2015 0859   BILIRUBINUR Negative 05/31/2015 1556   BILIRUBINUR NEGATIVE 05/09/2015 1737   BILIRUBINUR NEGATIVE 02/20/2015  0859   BILIRUBINUR negative 08/07/2012 1037   KETONESUR NEGATIVE 05/09/2015 1737   KETONESUR NEGATIVE 02/20/2015 0859   PROTEINUR NEGATIVE 05/09/2015 1737   PROTEINUR NEGATIVE 02/20/2015 0859   PROTEINUR negative 08/07/2012 1037   UROBILINOGEN 0.2 08/07/2012 1037   UROBILINOGEN 0.2 10/28/2009 1004   NITRITE Negative 05/31/2015 1556   NITRITE NEGATIVE 05/09/2015 1737   NITRITE NEGATIVE 02/20/2015 0859   NITRITE negative 08/07/2012 1037   LEUKOCYTESUR Negative 05/31/2015 1556   LEUKOCYTESUR 3+* 05/09/2015 1737   LEUKOCYTESUR SMALL 02/20/2015 0859  today's UA is clear.  Pertinent Imaging: I have reviewed her KUB and renal US and the stone had not passed and she still had hydro on the right.   Assessment & Plan:    Problem List Items Addressed This Visit    Right ureteral stone - Primary    She remains symptomatic and the KUB and [place] on 7/27 still show an obstructing distal stone on the right.  I am going to repeat a KUB today and if the stone is still present, I will get her set up for cystoscopy with right retrograde and ureteroscopic stone extraction with possible laser and stenting.   I have reviewed the risks of bleeding, infection, ureteral injury, need for secondary procedures and a stent, thrombotic events and anesthetic risks.       Relevant Orders   Urinalysis, Complete      No Follow-up on file.  Malka So, MD  [facility] [address] [place], [place] [address] [phone]

## 2015-06-29 ENCOUNTER — Other Ambulatory Visit: Payer: 59

## 2015-06-29 ENCOUNTER — Encounter: Payer: Self-pay | Admitting: *Deleted

## 2015-06-29 NOTE — Patient Instructions (Signed)
  Your procedure is scheduled on: 07-05-15 Report to [facility] To find out your arrival time please call [phone] between 1PM - 3PM on 07-02-15  Remember: Instructions that are not followed completely may result in serious medical risk, up to and including death, or upon the discretion of your surgeon and anesthesiologist your surgery may need to be rescheduled.    _X___ 1. Do not eat food or drink liquids after midnight. No gum chewing or hard candies.     _X___ 2. No Alcohol for 24 hours before or after surgery.   ____ 3. Bring all medications with you on the day of surgery if instructed.    ____ 4. Notify your doctor if there is any change in your medical condition     (cold, fever, infections).     Do not wear jewelry, make-up, hairpins, clips or nail polish.  Do not wear lotions, powders, or perfumes. You may wear deodorant.  Do not shave 48 hours prior to surgery. Men may shave face and neck.  Do not bring valuables to the hospital.    [facility] is not responsible for any belongings or valuables.               Contacts, dentures or bridgework may not be worn into surgery.  Leave your suitcase in the car. After surgery it may be brought to your room.  For patients admitted to the hospital, discharge time is determined by your treatment team.   Patients discharged the day of surgery will not be allowed to drive home.   Please read over the following fact sheets that you were given:      _X___ Take these medicines the morning of surgery with A SIP OF WATER:    1. LEVOTHYROXINE  2.   3.   4.  5.  6.  ____ Fleet Enema (as directed)   ____ Use CHG Soap as directed  ____ Use inhalers on the day of surgery  ____ Stop metformin 2 days prior to surgery    ____ Take 1/2 of usual insulin dose the night before surgery and none on the morning of surgery.   ____ Stop Coumadin/Plavix/aspirin-N/A  ____ Stop Anti-inflammatories-NO NSAIDS OR  ASA PRODUCTS-TYLENOL OK   ____ Stop supplements until after surgery.    ____ Bring C-Pap to the hospital.

## 2015-07-01 ENCOUNTER — Ambulatory Visit (INDEPENDENT_AMBULATORY_CARE_PROVIDER_SITE_OTHER): Payer: 59 | Admitting: Family Medicine

## 2015-07-01 ENCOUNTER — Encounter: Payer: Self-pay | Admitting: Family Medicine

## 2015-07-01 VITALS — BP 100/80 | HR 68 | Ht 62.0 in | Wt 139.0 lb

## 2015-07-01 DIAGNOSIS — N200 Calculus of kidney: Secondary | ICD-10-CM

## 2015-07-01 NOTE — Progress Notes (Signed)
Name: [name]   MRN: [id number]    DOB: Jun 08, 1976   Date:07/01/2015       Progress Note  Subjective  Chief Complaint  Chief Complaint  Patient presents with  . Nephrolithiasis    needs referral for surgery    Flank Pain This is a recurrent problem. The current episode started 1 to 4 weeks ago. The problem occurs constantly. The problem is unchanged. The pain is present in the lumbar spine. The quality of the pain is described as stabbing. Radiates to: suprapubic area. The pain is severe. The pain is the same all the time. Associated symptoms include abdominal pain. Pertinent negatives include no bladder incontinence, bowel incontinence, chest pain, dysuria, fever, headaches, leg pain, numbness, paresis, paresthesias, pelvic pain, perianal numbness, tingling, weakness or weight loss. Risk factors: hx of kidney stone. She has tried analgesics for the symptoms. The treatment provided mild relief.    No problem-specific assessment & plan notes found for this encounter.   Past Medical History  Diagnosis Date  . Preterm labor   . Kidney stone   . Thyroid disease   . Hypothyroidism   . Anemia     H/O 20 YRS AGO    Past Surgical History  Procedure Laterality Date  . Hemrrhoidectomy      age 39 or 39    Family History  Problem Relation Age of Onset  . Stroke Father   . Other Neg Hx     Social History   Social History  . Marital Status: Married    Spouse Name: N/A  . Number of Children: N/A  . Years of Education: N/A   Occupational History  . Not on file.   Social History Main Topics  . Smoking status: Never Smoker   . Smokeless tobacco: Never Used  . Alcohol Use: No  . Drug Use: No  . Sexual Activity:    Partners: Male    Birth Control/ Protection: Condom   Other Topics Concern  . Not on file   Social History Narrative    No Known Allergies   Review of Systems  Constitutional: Negative for fever and weight loss.  Cardiovascular: Negative for chest pain.   Gastrointestinal: Positive for abdominal pain. Negative for bowel incontinence.  Genitourinary: Positive for flank pain. Negative for bladder incontinence, dysuria and pelvic pain.  Neurological: Negative for tingling, weakness, numbness, headaches and paresthesias.     Objective  Filed Vitals:   07/01/15 1455  BP: 100/80  Pulse: 68  Height: 5\' 2"  (1.575 m)  Weight: 139 lb (63.05 kg)    Physical Exam    Assessment & Plan  Problem List Items Addressed This Visit    None    Visit Diagnoses    Kidney stone on right side    -  Primary    cont oxycodone/ proceed with referral for suspected stone obstruction/ cystoscopy    Relevant Orders    Ambulatory referral to Urology         Dr. Otilio Miu [facility]  07/01/2015

## 2015-07-05 ENCOUNTER — Encounter: Payer: Self-pay | Admitting: *Deleted

## 2015-07-05 ENCOUNTER — Ambulatory Visit: Payer: 59

## 2015-07-05 ENCOUNTER — Encounter
Admission: RE | Disposition: A | Discharge status: Home / Self Care | Payer: Self-pay | Source: Ambulatory Visit | Attending: Urology

## 2015-07-05 ENCOUNTER — Ambulatory Visit: Payer: 59 | Admitting: Anesthesiology

## 2015-07-05 ENCOUNTER — Ambulatory Visit
Admission: RE | Admit: 2015-07-05 | Discharge: 2015-07-05 | Disposition: A | Discharge status: Home / Self Care | Payer: 59 | Source: Ambulatory Visit | Attending: Urology | Admitting: Urology

## 2015-07-05 DIAGNOSIS — N201 Calculus of ureter: Secondary | ICD-10-CM | POA: Insufficient documentation

## 2015-07-05 DIAGNOSIS — E039 Hypothyroidism, unspecified: Secondary | ICD-10-CM | POA: Diagnosis not present

## 2015-07-05 DIAGNOSIS — Z87442 Personal history of urinary calculi: Secondary | ICD-10-CM | POA: Insufficient documentation

## 2015-07-05 DIAGNOSIS — R350 Frequency of micturition: Secondary | ICD-10-CM | POA: Diagnosis not present

## 2015-07-05 DIAGNOSIS — Z823 Family history of stroke: Secondary | ICD-10-CM | POA: Diagnosis not present

## 2015-07-05 DIAGNOSIS — N200 Calculus of kidney: Secondary | ICD-10-CM

## 2015-07-05 DIAGNOSIS — N132 Hydronephrosis with renal and ureteral calculous obstruction: Secondary | ICD-10-CM | POA: Insufficient documentation

## 2015-07-05 HISTORY — DX: Hypothyroidism, unspecified: E03.9

## 2015-07-05 HISTORY — DX: Anemia, unspecified: D64.9

## 2015-07-05 HISTORY — PX: CYSTOSCOPY/URETEROSCOPY/HOLMIUM LASER/STENT PLACEMENT: SHX6546

## 2015-07-05 LAB — POCT PREGNANCY, URINE: Preg Test, Ur: NEGATIVE

## 2015-07-05 SURGERY — CYSTOSCOPY/URETEROSCOPY/HOLMIUM LASER/STENT PLACEMENT
Anesthesia: General | Laterality: Right | Wound class: Clean Contaminated

## 2015-07-05 MED ORDER — HYDROCODONE-ACETAMINOPHEN 5-325 MG PO TABS: 1.0000 | ORAL_TABLET | Freq: Four times a day (QID) | ORAL | Status: DC | PRN

## 2015-07-05 MED ORDER — CIPROFLOXACIN IN D5W 400 MG/200ML IV SOLN
INTRAVENOUS | Status: AC
  Filled 2015-07-05: qty 200

## 2015-07-05 MED ORDER — OXYBUTYNIN CHLORIDE 5 MG PO TABS: 5.0000 mg | ORAL_TABLET | Freq: Three times a day (TID) | ORAL | Status: DC | PRN

## 2015-07-05 MED ORDER — MIDAZOLAM HCL 2 MG/2ML IJ SOLN
INTRAMUSCULAR | Status: DC | PRN
Start: 2015-07-05 — End: 2015-07-05
  Administered 2015-07-05: 2 mg via INTRAVENOUS

## 2015-07-05 MED ORDER — FAMOTIDINE 20 MG PO TABS
ORAL_TABLET | ORAL | Status: AC
  Filled 2015-07-05: qty 1

## 2015-07-05 MED ORDER — IOTHALAMATE MEGLUMINE 43 % IV SOLN
INTRAVENOUS | Status: DC | PRN
  Administered 2015-07-05: 15 mL

## 2015-07-05 MED ORDER — FAMOTIDINE 20 MG PO TABS
20.0000 mg | ORAL_TABLET | Freq: Once | ORAL | Status: AC
  Administered 2015-07-05: 20 mg via ORAL

## 2015-07-05 MED ORDER — CIPROFLOXACIN IN D5W 400 MG/200ML IV SOLN
400.0000 mg | Freq: Once | INTRAVENOUS | Status: AC
  Administered 2015-07-05: 400 mg via INTRAVENOUS

## 2015-07-05 MED ORDER — LACTATED RINGERS IV SOLN
INTRAVENOUS | Status: DC
  Administered 2015-07-05: 10:00:00 via INTRAVENOUS

## 2015-07-05 MED ORDER — KETOROLAC TROMETHAMINE 30 MG/ML IJ SOLN
INTRAMUSCULAR | Status: DC | PRN
  Administered 2015-07-05: 30 mg via INTRAVENOUS

## 2015-07-05 MED ORDER — HYDROMORPHONE HCL 1 MG/ML IJ SOLN
0.2500 mg | INTRAMUSCULAR | Status: DC | PRN
Start: 2015-07-05 — End: 2015-07-05

## 2015-07-05 MED ORDER — PROPOFOL 10 MG/ML IV BOLUS
INTRAVENOUS | Status: DC | PRN
  Administered 2015-07-05: 200 mg via INTRAVENOUS

## 2015-07-05 MED ORDER — ONDANSETRON HCL 4 MG/2ML IJ SOLN
4.0000 mg | Freq: Once | INTRAMUSCULAR | Status: DC | PRN
Start: 2015-07-05 — End: 2015-07-05

## 2015-07-05 MED ORDER — ONDANSETRON HCL 4 MG/2ML IJ SOLN
INTRAMUSCULAR | Status: DC | PRN
  Administered 2015-07-05: 4 mg via INTRAVENOUS

## 2015-07-05 MED ORDER — FENTANYL CITRATE (PF) 100 MCG/2ML IJ SOLN
INTRAMUSCULAR | Status: DC | PRN
  Administered 2015-07-05: 50 ug via INTRAVENOUS

## 2015-07-05 MED ORDER — DOCUSATE SODIUM 100 MG PO CAPS
100.0000 mg | ORAL_CAPSULE | Freq: Two times a day (BID) | ORAL | Status: DC
Start: 2015-07-05 — End: 2015-09-17

## 2015-07-05 SURGICAL SUPPLY — 32 items
ADAPTER SCOPE UROLOK II (MISCELLANEOUS) IMPLANT
BAG DRAIN CYSTO-URO LG1000N (MISCELLANEOUS) ×3 IMPLANT
BASKET ZERO TIP 1.9FR (BASKET) IMPLANT
CATH URETL 5X70 OPEN END (CATHETERS) ×3 IMPLANT
CNTNR SPEC 2.5X3XGRAD LEK (MISCELLANEOUS) ×1
CONRAY 43 FOR UROLOGY 50M (MISCELLANEOUS) ×3 IMPLANT
CONT SPEC 4OZ STER OR WHT (MISCELLANEOUS) ×2
CONTAINER SPEC 2.5X3XGRAD LEK (MISCELLANEOUS) ×1 IMPLANT
GLOVE BIO SURGEON STRL SZ 6.5 (GLOVE) ×2 IMPLANT
GLOVE BIO SURGEON STRL SZ7 (GLOVE) ×6 IMPLANT
GLOVE BIO SURGEONS STRL SZ 6.5 (GLOVE) ×1
GOWN STRL REUS W/ TWL LRG LVL3 (GOWN DISPOSABLE) ×2 IMPLANT
GOWN STRL REUS W/TWL LRG LVL3 (GOWN DISPOSABLE) ×4
INTRODUCER DILATOR DOUBLE (INTRODUCER) ×3 IMPLANT
JELLY LUB 2OZ STRL (MISCELLANEOUS) ×2
JELLY LUBE 2OZ STRL (MISCELLANEOUS) ×1 IMPLANT
KIT RM TURNOVER CYSTO AR (KITS) ×3 IMPLANT
LASER HOLMIUM SU 200UM (MISCELLANEOUS) ×3 IMPLANT
LASER HOLMIUM SU 940UM (MISCELLANEOUS) ×3 IMPLANT
PACK CYSTO AR (MISCELLANEOUS) ×3 IMPLANT
PREP PVP WINGED SPONGE (MISCELLANEOUS) ×3 IMPLANT
PUMP SINGLE ACTION SAP (PUMP) ×3 IMPLANT
SENSORWIRE 0.038 NOT ANGLED (WIRE) ×6
SET CYSTO W/LG BORE CLAMP LF (SET/KITS/TRAYS/PACK) ×3 IMPLANT
SHEATH URETERAL 12FRX35CM (MISCELLANEOUS) ×3 IMPLANT
SOL .9 NS 3000ML IRR  AL (IV SOLUTION) ×2
SOL .9 NS 3000ML IRR UROMATIC (IV SOLUTION) ×1 IMPLANT
STENT URET 6FRX22 CONTOUR (STENTS) ×3 IMPLANT
STENT URET 6FRX24 CONTOUR (STENTS) IMPLANT
STENT URET 6FRX26 CONTOUR (STENTS) IMPLANT
WATER STERILE IRR 1000ML POUR (IV SOLUTION) ×3 IMPLANT
WIRE SENSOR 0.038 NOT ANGLED (WIRE) ×2 IMPLANT

## 2015-07-05 NOTE — Anesthesia Postprocedure Evaluation (Signed)
  Anesthesia Post-op Note  Patient: [name]  Procedure(s) Performed: Procedure(s): CYSTOSCOPY/URETEROSCOPY/HOLMIUM LASER/STENT PLACEMENT (Right)  Anesthesia type:General  Patient location: PACU  Post pain: Pain level controlled  Post assessment: Post-op Vital signs reviewed, Patient's Cardiovascular Status Stable, Respiratory Function Stable, Patent Airway and No signs of Nausea or vomiting  Post vital signs: Reviewed and stable  Last Vitals:  Filed Vitals:   07/05/15 1201  BP: 105/70  Pulse: 51  Temp: 36.3 C  Resp: 10    Level of consciousness: awake, alert  and patient cooperative  Complications: No apparent anesthesia complications

## 2015-07-05 NOTE — Discharge Instructions (Signed)
You have a ureteral stent in place.  This is a tube that extends from your kidney to your bladder.  This may cause urinary bleeding, burning with urination, and urinary frequency.  Please call our office or present to the ED if you develop fevers >101 or pain which is not able to be controlled with oral pain medications.  You may be given either Flomax and/ or ditropan to help with bladder spasms and stent pain in addition to pain medications.    Your stent is attached to a string which is taped to your left inner thigh. On Wednesday, you may untaped the string, and gently pulled the string until the stent was completely removed. It is normal to have some cramping and abdominal pain following stent removal for up to 24 hours. If you have any trouble, please contact our office for assistance.  [facility] [address], [place], [place] [address] [phone] AMBULATORY SURGERY  DISCHARGE INSTRUCTIONS   1) The drugs that you were given will stay in your system until tomorrow so for the next 24 hours you should not:  A) Drive an automobile B) Make any legal decisions C) Drink any alcoholic beverage   2) You may resume regular meals tomorrow.  Today it is better to start with liquids and gradually work up to solid foods.  You may eat anything you prefer, but it is better to start with liquids, then soup and crackers, and gradually work up to solid foods.   3) Please notify your doctor immediately if you have any unusual bleeding, trouble breathing, redness and pain at the surgery site, drainage, fever, or pain not relieved by medication.    4) Additional Instructions:        Please contact your physician with any problems or Same Day Surgery at [phone], Monday through Friday 6 am to 4 pm, or [place] at [facility] number at [phone].AMBULATORY SURGERY  DISCHARGE INSTRUCTIONS   5) The drugs that you were given will stay in your  system until tomorrow so for the next 24 hours you should not:  D) Drive an automobile E) Make any legal decisions F) Drink any alcoholic beverage   6) You may resume regular meals tomorrow.  Today it is better to start with liquids and gradually work up to solid foods.  You may eat anything you prefer, but it is better to start with liquids, then soup and crackers, and gradually work up to solid foods.   7) Please notify your doctor immediately if you have any unusual bleeding, trouble breathing, redness and pain at the surgery site, drainage, fever, or pain not relieved by medication.    8) Additional Instructions:        Please contact your physician with any problems or Same Day Surgery at [phone], Monday through Friday 6 am to 4 pm, or  at [facility] number at [phone].

## 2015-07-05 NOTE — Interval H&P Note (Signed)
History and Physical Interval Note:  07/05/2015 10:56 AM  [name]  has presented today for surgery, with the diagnosis of right DISTAL URETERAL STONE  The various methods of treatment have been discussed with the patient and family. After consideration of risks, benefits and other options for treatment, the patient has consented to  Procedure(s): CYSTOSCOPY/URETEROSCOPY/HOLMIUM LASER/STENT PLACEMENT (Right) as a surgical intervention .  The patient's history has been reviewed, patient examined, no change in status, stable for surgery.  I have reviewed the patient's chart and labs.  Questions were answered to the patient's satisfaction.    KUB reviewed today.  Distal stone remains present.     Hollice Espy

## 2015-07-05 NOTE — Op Note (Signed)
Date of procedure: 07/05/2015  Preoperative diagnosis:  1. Right distal ureteral stone   Postoperative diagnosis:  1. Right distal ureteral stone   Procedure: 1. Right ureteroscopy, laser lithotripsy 2. Basket extraction of right stone fragment 3. Right retrograde pyelogram 4. Right ureteral stent placement  Surgeon: Hollice Espy, MD  Anesthesia: General  Complications: None  Intraoperative findings: 5 mm right distal ureteral stone identified, obliterated, and fragments removed. Fairly significant right hydro ureterohydronephrosis with proximal tortuosity, unable to advance scope into right kidney due to torturous ureter.  EBL: Minimal  Specimens: None  Drains: 6 x 22 French double-J ureteral stent on right (string left in place)  Indication: [name] is a 39 y.o. patient with right flank pain found to have a 5 mm right distal ureteral stone. She also some very small upper tract punctate stones bilaterally..  After reviewing the management options for treatment, he elected to proceed with the above surgical procedure(s). We have discussed the potential benefits and risks of the procedure, side effects of the proposed treatment, the likelihood of the patient achieving the goals of the procedure, and any potential problems that might occur during the procedure or recuperation. Informed consent has been obtained.  Description of procedure:  The patient was taken to the operating room and general anesthesia was induced.  The patient was placed in the dorsal lithotomy position, prepped and draped in the usual sterile fashion, and preoperative antibiotics were administered. A preoperative time-out was performed.   A rigid 33 French cystoscope was advanced per urethra into the bladder. Attention was turned to the right ureteral orifice which was cannulated using a sensor wire up to level of the kidney without difficulty. This wire was then snapped in place as a safety wire. A rigid  ureteroscope was then advanced through the distal ureter and a 365  laser fiber was brought in and using settings of 0.8 J and 10 Hz, the stone was fragmented into approximately 5 or 6 very small pieces. Each of the pieces were then individually basketed out of the distal ureter using a 1.9 [place] to plus nitinol basket. The distal ureter was completely clear fragment at this point time and the scope was able to be advanced up to the mid proximal ureter without difficulty revealing no additional fragments. A second wire was then placed under fluoroscopic guidance to the kidney. A flexible digital ureteroscope was then advanced over the working wire up to level of the proximal ureter. At this point time, I was unable to clearly identify any structures within the kidney therefore retropyelogram was performed to confirm positioning of the scope. This revealed that the scope and the previous wires were indeed in the proximal ureter and there is a significant tortuosity, loop in the proximal ureter preventing the wire and the scope from passing easily into the kidney. The kidney itself was fairly dilated with moderate to severe hydronephrosis. Unfortunately, after several maneuvers, I was unable to advance the scope easily therefore aborted the attempt to treat her right upper tract stones. The scope was backed down the length of the ureter inspecting along the way which revealed no evidence of ureteral injury, trauma, or significant edema. A 6 x 22 French double-J ureteral stent with a string in place was advanced over the wire to level of the kidney vs. dilated proximal ureter.  The wire was then fully withdrawn and a coil was noted within the bladder. The bladder was drained using the sheath from cystoscope. The string was  left in place. The string was secured to the patient's left inner thigh using Mastisol and Tegaderm. She was then repositioned supine position, reversed from anesthesia, and taken to the PACU in  stable condition. There were no complications in this case.  Plan: Patient will remove her own stent in 2 days. She'll then follow-up in the office in approximately 4 weeks following her return from [place] with a renal ultrasound prior to assess for resolution of her hydronephrosis.  Hollice Espy, M.D.

## 2015-07-05 NOTE — Anesthesia Preprocedure Evaluation (Addendum)
Anesthesia Evaluation  Patient identified by MRN, date of birth, ID band Patient awake    Reviewed: Allergy & Precautions, NPO status , Patient's Chart, lab work & pertinent test results  Airway Mallampati: II  TM Distance: >3 FB Neck ROM: Full    Dental  (+) Teeth Intact   Pulmonary  breath sounds clear to auscultation  Pulmonary exam normal       Cardiovascular Exercise Tolerance: Good negative cardio ROS Normal cardiovascular examRhythm:Regular Rate:Normal     Neuro/Psych    GI/Hepatic   Endo/Other  Treated hypothyroidism.  Renal/GU Renal diseaseStones.     Musculoskeletal   Abdominal (+)  Abdomen: soft.    Peds  Hematology negative hematology ROS (+) Hx of anemia is remote.   Anesthesia Other Findings   Reproductive/Obstetrics                             Anesthesia Physical Anesthesia Plan  ASA: II  Anesthesia Plan: General   Post-op Pain Management:    Induction: Intravenous  Airway Management Planned:   Additional Equipment:   Intra-op Plan:   Post-operative Plan: Extubation in OR  Informed Consent: I have reviewed the patients History and Physical, chart, labs and discussed the procedure including the risks, benefits and alternatives for the proposed anesthesia with the patient or authorized representative who has indicated his/her understanding and acceptance.     Plan Discussed with: CRNA  Anesthesia Plan Comments:         Anesthesia Quick Evaluation

## 2015-07-05 NOTE — Transfer of Care (Signed)
Immediate Anesthesia Transfer of Care Note  Patient: [name]  Procedure(s) Performed: Procedure(s): CYSTOSCOPY/URETEROSCOPY/HOLMIUM LASER/STENT PLACEMENT (Right)  Patient Location: PACU  Anesthesia Type:General  Level of Consciousness: sedated and responds to stimulation  Airway & Oxygen Therapy: Patient Spontanous Breathing and Patient connected to face mask oxygen  Post-op Assessment: Report given to RN and Post -op Vital signs reviewed and stable  Post vital signs: Reviewed and stable  Last Vitals:  Filed Vitals:   07/05/15 1201  BP: 105/70  Pulse: 51  Temp: 36.3 C  Resp: 10    Complications: No apparent anesthesia complications

## 2015-07-05 NOTE — H&P (View-Only) (Signed)
06/28/2015 11:30 AM   [name] 02-Nov-1976 [id number]  Referring provider: Jinny Sanders, MD [address] [place], [place] [address]  Chief Complaint  Patient presents with  . Nephrolithiasis    2wk    HPI: [name] returns today in f/u for her right distal stone.  She had f/u KUB and renal US on 7/27 that showed a persistent right distal stone and hydro.  She continues to have a little bit of pain in the right low back.  She has some frequency but it is with hydration.       PMH: Past Medical History  Diagnosis Date  . Preterm labor   . Kidney stone   . Thyroid disease     Surgical History: Past Surgical History  Procedure Laterality Date  . Hemrrhoidectomy      age 17 or 3    Home Medications:    Medication List       This list is accurate as of: 06/28/15 11:30 AM.  Always use your most recent med list.               levothyroxine 75 MCG tablet  Commonly known as:  SYNTHROID, LEVOTHROID  Take 1 tablet (75 mcg total) by mouth daily.        Allergies: No Known Allergies  Family History: Family History  Problem Relation Age of Onset  . Stroke Father   . Other Neg Hx     Social History:  reports that she has never smoked. She has never used smokeless tobacco. She reports that she does not drink alcohol or use illicit drugs.  ROS: UROLOGY Frequent Urination?: No Hard to postpone urination?: No Burning/pain with urination?: No Get up at night to urinate?: No Leakage of urine?: No Urine stream starts and stops?: No Trouble starting stream?: No Do you have to strain to urinate?: No Blood in urine?: No Urinary tract infection?: No Sexually transmitted disease?: No Injury to kidneys or bladder?: No Painful intercourse?: No Weak stream?: No Currently pregnant?: No Vaginal bleeding?: No Last menstrual period?: 06-24-15  Gastrointestinal Nausea?: No Vomiting?: No Indigestion/heartburn?: No Diarrhea?: No Constipation?:  No  Constitutional Fever: No Night sweats?: No Weight loss?: No Fatigue?: No  Skin Skin rash/lesions?: No Itching?: No  Eyes Blurred vision?: No Double vision?: No  Ears/Nose/Throat Sore throat?: No Sinus problems?: No  Hematologic/Lymphatic Swollen glands?: No Easy bruising?: No  Cardiovascular Leg swelling?: No Chest pain?: No  Respiratory Cough?: No Shortness of breath?: No  Endocrine Excessive thirst?: No  Musculoskeletal Back pain?: Yes Joint pain?: No  Neurological Headaches?: No Dizziness?: No  Psychologic Depression?: No Anxiety?: No  Physical Exam: BP 126/83 mmHg  Pulse 66  Ht 5\' 2"  (1.575 m)  Wt 139 lb 14.4 oz (63.458 kg)  BMI 25.58 kg/m2  LMP 06/25/2015  Breastfeeding? No  Physical Exam  Constitutional: She appears well-developed and well-nourished. No distress.  Cardiovascular: Normal rate, regular rhythm and normal heart sounds.   Pulmonary/Chest: Effort normal and breath sounds normal. No respiratory distress.  Abdominal:  No CVAT noted     Laboratory Data: Lab Results  Component Value Date   WBC 8.7 05/09/2015   HGB 10.0* 05/09/2015   HCT 31.9* 05/09/2015   MCV 75.0* 05/09/2015   PLT 243 05/09/2015    Lab Results  Component Value Date   CREATININE 0.54 05/09/2015    No results found for: PSA  No results found for: TESTOSTERONE  No results found for: HGBA1C  Urinalysis    Component Value Date/Time   COLORURINE YELLOW* 05/09/2015 1737   COLORURINE YELLOW 02/20/2015 0859   APPEARANCEUR HAZY* 05/09/2015 1737   APPEARANCEUR CLEAR 02/20/2015 0859   LABSPEC 1.012 05/09/2015 1737   LABSPEC 1.015 02/20/2015 0859   PHURINE 7.0 05/09/2015 1737   PHURINE 7.0 02/20/2015 0859   GLUCOSEU Negative 05/31/2015 1556   GLUCOSEU NEGATIVE 02/20/2015 0859   HGBUR 2+* 05/09/2015 1737   HGBUR SMALL 02/20/2015 0859   BILIRUBINUR Negative 05/31/2015 1556   BILIRUBINUR NEGATIVE 05/09/2015 1737   BILIRUBINUR NEGATIVE 02/20/2015  0859   BILIRUBINUR negative 08/07/2012 1037   KETONESUR NEGATIVE 05/09/2015 1737   KETONESUR NEGATIVE 02/20/2015 0859   PROTEINUR NEGATIVE 05/09/2015 1737   PROTEINUR NEGATIVE 02/20/2015 0859   PROTEINUR negative 08/07/2012 1037   UROBILINOGEN 0.2 08/07/2012 1037   UROBILINOGEN 0.2 10/28/2009 1004   NITRITE Negative 05/31/2015 1556   NITRITE NEGATIVE 05/09/2015 1737   NITRITE NEGATIVE 02/20/2015 0859   NITRITE negative 08/07/2012 1037   LEUKOCYTESUR Negative 05/31/2015 1556   LEUKOCYTESUR 3+* 05/09/2015 1737   LEUKOCYTESUR SMALL 02/20/2015 0859  today's UA is clear.  Pertinent Imaging: I have reviewed her KUB and renal US and the stone had not passed and she still had hydro on the right.   Assessment & Plan:    Problem List Items Addressed This Visit    Right ureteral stone - Primary    She remains symptomatic and the KUB and [place] on 7/27 still show an obstructing distal stone on the right.  I am going to repeat a KUB today and if the stone is still present, I will get her set up for cystoscopy with right retrograde and ureteroscopic stone extraction with possible laser and stenting.   I have reviewed the risks of bleeding, infection, ureteral injury, need for secondary procedures and a stent, thrombotic events and anesthetic risks.       Relevant Orders   Urinalysis, Complete      No Follow-up on file.  Malka So, MD  [facility] [address] [place], [place] [address] [phone]

## 2015-07-05 NOTE — Progress Notes (Signed)
To xray for KUB via w/c by Lujean Rave, CNA at [phone]

## 2015-07-05 NOTE — Anesthesia Procedure Notes (Signed)
Procedure Name: LMA Insertion Date/Time: 07/05/2015 11:14 AM Performed by: Jonna Clark Pre-anesthesia Checklist: Patient identified, Patient being monitored, Timeout performed, Emergency Drugs available and Suction available Patient Re-evaluated:Patient Re-evaluated prior to inductionOxygen Delivery Method: Circle system utilized Preoxygenation: Pre-oxygenation with 100% oxygen Intubation Type: IV induction Ventilation: Mask ventilation without difficulty LMA: LMA inserted LMA Size: 3.5 Tube type: Oral Number of attempts: 1 Placement Confirmation: positive ETCO2 and breath sounds checked- equal and bilateral Tube secured with: Tape Dental Injury: Teeth and Oropharynx as per pre-operative assessment

## 2015-07-12 ENCOUNTER — Ambulatory Visit (INDEPENDENT_AMBULATORY_CARE_PROVIDER_SITE_OTHER): Payer: 59

## 2015-07-12 DIAGNOSIS — R339 Retention of urine, unspecified: Secondary | ICD-10-CM

## 2015-07-12 LAB — URINALYSIS, COMPLETE
BILIRUBIN UA: NEGATIVE
GLUCOSE, UA: NEGATIVE
KETONES UA: NEGATIVE
Nitrite, UA: NEGATIVE
PROTEIN UA: NEGATIVE
SPEC GRAV UA: 1.015 (ref 1.005–1.030)
Urobilinogen, Ur: 0.2 mg/dL (ref 0.2–1.0)
pH, UA: 7 (ref 5.0–7.5)

## 2015-07-12 LAB — MICROSCOPIC EXAMINATION
Epithelial Cells (non renal): >10 /hpf — ABNORMAL HIGH (ref 0–10)
RBC MICROSCOPIC, UA: NONE SEEN /HPF (ref 0–?)
WBC, UA: >30 /hpf — ABNORMAL HIGH (ref 0–?)

## 2015-07-12 LAB — BLADDER SCAN AMB NON-IMAGING: Scan Result: 305

## 2015-07-12 NOTE — Progress Notes (Signed)
[name] Patient can void: Pt was able to fill up specimen cup and finish voiding into the toilet. Per Dr. Junious Silk pt was instructed to stop oxybutynin and RTC Friday for another bladder scan.  Performed By: Toniann Fail, LPN

## 2015-07-15 ENCOUNTER — Other Ambulatory Visit: Payer: Self-pay

## 2015-07-15 DIAGNOSIS — E039 Hypothyroidism, unspecified: Secondary | ICD-10-CM

## 2015-07-15 MED ORDER — LEVOTHYROXINE SODIUM 75 MCG PO TABS: 75.0000 ug | ORAL_TABLET | Freq: Every day | ORAL | Status: DC

## 2015-07-16 ENCOUNTER — Ambulatory Visit (INDEPENDENT_AMBULATORY_CARE_PROVIDER_SITE_OTHER): Payer: 59

## 2015-07-16 DIAGNOSIS — R339 Retention of urine, unspecified: Secondary | ICD-10-CM

## 2015-07-16 LAB — BLADDER SCAN AMB NON-IMAGING: SCAN RESULT: 400

## 2015-07-16 NOTE — Progress Notes (Signed)
Continuous Intermittent Catheterization  Due to urinary retention patient is present today for a teaching of self I & O Catheterization. Patient was given detailed verbal and printed instructions of self catheterization. Patient was cleaned and prepped in a sterile fashion.  With instruction and assistance patient inserted a 14FR and urine return was noted 300 ml, urine was clear and yellow in color. Patient tolerated well, no complications were noted Patient was given a sample bag with supplies to take home.  Instructions were given per Larene Beach for patient to cath one times daily. Pt was given samples to take to [place] with her. Pt will follow up when she returns to [place].  Preformed by: Toniann Fail, LPN

## 2015-07-18 ENCOUNTER — Encounter: Payer: Self-pay | Admitting: *Deleted

## 2015-07-18 ENCOUNTER — Emergency Department
Admission: EM | Admit: 2015-07-18 | Discharge: 2015-07-18 | Disposition: A | Discharge status: Home / Self Care | Payer: 59 | Attending: Emergency Medicine | Admitting: Emergency Medicine

## 2015-07-18 ENCOUNTER — Ambulatory Visit
Admission: EM | Admit: 2015-07-18 | Discharge: 2015-07-18 | Disposition: A | Discharge status: Home / Self Care | Payer: 59 | Attending: Family Medicine | Admitting: Family Medicine

## 2015-07-18 DIAGNOSIS — Z792 Long term (current) use of antibiotics: Secondary | ICD-10-CM | POA: Diagnosis not present

## 2015-07-18 DIAGNOSIS — Z79899 Other long term (current) drug therapy: Secondary | ICD-10-CM | POA: Diagnosis not present

## 2015-07-18 DIAGNOSIS — R339 Retention of urine, unspecified: Secondary | ICD-10-CM | POA: Diagnosis not present

## 2015-07-18 DIAGNOSIS — N3000 Acute cystitis without hematuria: Secondary | ICD-10-CM

## 2015-07-18 LAB — URINALYSIS COMPLETE WITH MICROSCOPIC (ARMC ONLY)
Bilirubin Urine: NEGATIVE
Glucose, UA: NEGATIVE mg/dL
HGB URINE DIPSTICK: NEGATIVE
KETONES UR: NEGATIVE mg/dL
NITRITE: NEGATIVE
PROTEIN: NEGATIVE mg/dL
SPECIFIC GRAVITY, URINE: 1.012 (ref 1.005–1.030)
pH: 6 (ref 5.0–8.0)

## 2015-07-18 LAB — BASIC METABOLIC PANEL
ANION GAP: 5 (ref 5–15)
BUN: 15 mg/dL (ref 6–20)
CHLORIDE: 110 mmol/L (ref 101–111)
CO2: 24 mmol/L (ref 22–32)
CREATININE: 0.51 mg/dL (ref 0.44–1.00)
Calcium: 8.7 mg/dL — ABNORMAL LOW (ref 8.9–10.3)
GFR calc non Af Amer: >60 mL/min (ref >60)
Glucose, Bld: 102 mg/dL — ABNORMAL HIGH (ref 65–99)
POTASSIUM: 4.2 mmol/L (ref 3.5–5.1)
SODIUM: 139 mmol/L (ref 135–145)

## 2015-07-18 MED ORDER — TAMSULOSIN HCL 0.4 MG PO CAPS: 0.4000 mg | ORAL_CAPSULE | Freq: Every day | ORAL | Status: DC

## 2015-07-18 MED ORDER — CIPROFLOXACIN HCL 500 MG PO TABS: 500.0000 mg | ORAL_TABLET | Freq: Two times a day (BID) | ORAL | Status: AC

## 2015-07-18 NOTE — ED Notes (Signed)
States had Ureteroscopy? Done 2 weeks ago by Dr. Erlene Quan at [facility] 2 weeks ago. States started with urinary retention 2 days later. Last catheterized was 2230hrs, self catheterized for very little urine. C/o right flank pain, and pressure. Flying to [place] tomorrow.

## 2015-07-18 NOTE — ED Notes (Signed)
Pt comes in today to make sure it is okay for her to fly back to [facility] tomorrow. Pt was dx with 2 large kidney stones, one of which the urologist was able to crush, but the other he could not get d/t kidney swelling. A stent was placed, then removed a few days later. Pt was having trouble urinating so was taken off the oxybutinin? And was told to straight cath herself in the interim. Pt will not be able to keep her follow up appt with urologist d/t going back home to [facility] tomorrow.

## 2015-07-18 NOTE — Discharge Instructions (Signed)
As discussed, go directly to Emergency room now. Dr Jeffie Pollock has recommended that you have an ultrasound of your kidneys and further evaluation. This is important. Follow up with your urologists as previously directed. Return to Urgent care for new or worsening concerns.   Acute Urinary Retention Acute urinary retention is the temporary inability to urinate. This is an uncommon problem in women. It can be caused by:  Infection.  A side effect of a medicine.  A problem in a nearby organ that presses or squeezes on the bladder or the urethra (the tube that drains the bladder).  Psychological problems.   Surgery on your bladder, urethra, or pelvic organs that causes obstruction to the outflow of urine from your bladder. HOME CARE INSTRUCTIONS  If you are sent home with a Foley catheter and a drainage system, you will need to discuss the best course of action with your health care provider. While the catheter is in, maintain a good intake of fluids. Keep the drainage bag emptied and lower than your catheter. This is so that contaminated urine will not flow back into your bladder, which could lead to a urinary tract infection. There are two main types of drainage bags. One is a large bag that usually is used at night. It has a good capacity that will allow you to sleep through the night without having to empty it. The second type is called a leg bag. It has a smaller capacity so it needs to be emptied more frequently. However, the main advantage is that it can be attached by a leg strap and goes underneath your clothing, allowing you the freedom to move about or leave your home. Only take over-the-counter or prescription medicines for pain, discomfort, or fever as directed by your health care provider.  SEEK MEDICAL CARE IF:  You develop a low-grade fever.  You experience spasms or leakage of urine with the spasms. SEEK IMMEDIATE MEDICAL CARE IF:   You develop chills or fever.  Your catheter stops  draining urine.  Your catheter falls out.  You start to develop increased bleeding that does not respond to rest and increased fluid intake. MAKE SURE YOU:  Understand these instructions.  Will watch your condition.  Will get help right away if you are not doing well or get worse. Document Released: 11/05/2006 Document Revised: 08/27/2013 Document Reviewed: 04/17/2013 [facility] Patient Information 2015 [place], [place]. This information is not intended to replace advice given to you by your health care provider. Make sure you discuss any questions you have with your health care provider.

## 2015-07-18 NOTE — ED Notes (Signed)
Pt did a demonstration of how to use the straight cath as a teach back method. Pt did the procedure correctly

## 2015-07-18 NOTE — Discharge Instructions (Signed)
Urinary Tract Infection A urinary tract infection (UTI) can occur any place along the urinary tract. The tract includes the kidneys, ureters, bladder, and urethra. A type of germ called bacteria often causes a UTI. UTIs are often helped with antibiotic medicine.  HOME CARE   If given, take antibiotics as told by your doctor. Finish them even if you start to feel better.  Drink enough fluids to keep your pee (urine) clear or pale yellow.  Avoid tea, drinks with caffeine, and bubbly (carbonated) drinks.  Pee often. Avoid holding your pee in for a long time.  Pee before and after having sex (intercourse).  Wipe from front to back after you poop (bowel movement) if you are a woman. Use each tissue only once. GET HELP RIGHT AWAY IF:   You have back pain.  You have lower belly (abdominal) pain.  You have chills.  You feel sick to your stomach (nauseous).  You throw up (vomit).  Your burning or discomfort with peeing does not go away.  You have a fever.  Your symptoms are not better in 3 days. MAKE SURE YOU:   Understand these instructions.  Will watch your condition.  Will get help right away if you are not doing well or get worse. Document Released: 04/24/2008 Document Revised: 07/31/2012 Document Reviewed: 06/06/2012 [facility] Patient Information 2015 [place], [place]. This information is not intended to replace advice given to you by your health care provider. Make sure you discuss any questions you have with your health care provider.  Please return immediately if condition worsens. Please contact her primary physician or the physician you were given for referral. If you have any specialist physicians involved in her treatment and plan please also contact them. Thank you for using [facility]. Urine culture is pending, you should be contacted if the antibiotic is not the one that's suited to cover urinary tract infection.

## 2015-07-18 NOTE — ED Provider Notes (Signed)
Time Seen: Approximately 1120  I have reviewed the triage notes  Chief Complaint: Urinary Retention   History of Present Illness: [name] is a 39 y.o. female who speaks mainly [place] but is declined on any translating service. She does have a family member that's with her this translating for her. Patient has recently been under treatment for renal colic. She was referred here from an outpatient clinic for evaluation of urinary retention. She had a stent which is been removed. She states that she was advised to self catheterize herself at home. She states she's only periodically been doing that though also states that she's had the feelings of urinary retention. She describes some mild right flank pain with no associated fever or nausea or vomiting. Past Medical History  Diagnosis Date  . Preterm labor   . Kidney stone   . Thyroid disease   . Hypothyroidism   . Anemia     H/O 20 YRS AGO  . Hypothyroidism     Patient Active Problem List   Diagnosis Date Noted  . Right ureteral stone 05/31/2015  . Uterine fibroid 05/31/2015  . GBS (group B Streptococcus carrier), +RV culture, currently pregnant 08/13/2012  . Hemorrhoids complicating pregnancy or puerperium with antenatal complication 25/42/7062  . Marginal insertion of umbilical cord 37/62/8315  . Supervision of high-risk pregnancy 01/16/2012  . Uterine fibroids affecting pregnancy, antepartum 01/16/2012  . Maternal hypothyroidism, antepartum 01/16/2012  . Hypothyroidism 12/03/2010  . ALLERGIC RHINITIS 07/27/2009  . ANEMIA-IRON DEFICIENCY 05/06/2009  . 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 EXTERNAL HEMORRHOIDS 05/06/2009    Past Surgical History  Procedure Laterality Date  . Hemrrhoidectomy      age 26 or 69  . Cystoscopy/ureteroscopy/holmium laser/stent placement Right 07/05/2015    Procedure: CYSTOSCOPY/URETEROSCOPY/HOLMIUM LASER/STENT PLACEMENT;  Surgeon: Hollice Espy, MD;  Location: [facility] ORS;  Service: Urology;   Laterality: Right;    Past Surgical History  Procedure Laterality Date  . Hemrrhoidectomy      age 83 or 34  . Cystoscopy/ureteroscopy/holmium laser/stent placement Right 07/05/2015    Procedure: CYSTOSCOPY/URETEROSCOPY/HOLMIUM LASER/STENT PLACEMENT;  Surgeon: Hollice Espy, MD;  Location: [facility] ORS;  Service: Urology;  Laterality: Right;    Current Outpatient Rx  Name  Route  Sig  Dispense  Refill  . ciprofloxacin (CIPRO) 500 MG tablet   Oral   Take 1 tablet (500 mg total) by mouth 2 (two) times daily.   10 tablet   0   . docusate sodium (COLACE) 100 MG capsule   Oral   Take 1 capsule (100 mg total) by mouth 2 (two) times daily.   60 capsule   0   . HYDROcodone-acetaminophen (NORCO/VICODIN) 5-325 MG per tablet   Oral   Take 1-2 tablets by mouth every 6 (six) hours as needed for moderate pain.   15 tablet   0   . levothyroxine (SYNTHROID, LEVOTHROID) 75 MCG tablet   Oral   Take 1 tablet (75 mcg total) by mouth daily.   30 tablet   0   . oxybutynin (DITROPAN) 5 MG tablet   Oral   Take 1 tablet (5 mg total) by mouth every 8 (eight) hours as needed for bladder spasms.   30 tablet   0   . tamsulosin (FLOMAX) 0.4 MG CAPS capsule   Oral   Take 1 capsule (0.4 mg total) by mouth daily.   10 capsule   0     Allergies:  Oxybutynin and Oxycodone  Family History: Family History  Problem Relation Age of Onset  . Stroke Father   . Other Neg Hx     Social History: Social History  Substance Use Topics  . Smoking status: Never Smoker   . Smokeless tobacco: Never Used  . Alcohol Use: No     Review of Systems:   10 point review of systems was performed and was otherwise negative:  Constitutional: No fever Eyes: No visual disturbances ENT: No sore throat, ear pain Cardiac: No chest pain Respiratory: No shortness of breath, wheezing, or stridor Abdomen: No abdominal pain, no vomiting, No diarrhea Endocrine: No weight loss, No night sweats Extremities: No  peripheral edema, cyanosis Skin: No rashes, easy bruising Neurologic: No focal weakness, trouble with speech or swollowing Urologic: No dysuria, Hematuria, or urinary frequency    Physical Exam:  ED Triage Vitals  Enc Vitals Group     BP 07/18/15 1101 128/80 mmHg     Pulse Rate 07/18/15 1101 62     Resp 07/18/15 1101 20     Temp 07/18/15 1101 97.5 F (36.4 C)     Temp Source 07/18/15 1101 Oral     SpO2 07/18/15 1101 99 %     Weight 07/18/15 1101 137 lb 4 oz (62.256 kg)     Height 07/18/15 1101 5\' 2"  (1.575 m)     Head Cir --      Peak Flow --      Pain Score --      Pain Loc --      Pain Edu? --      Excl. in [place]? --     General: Awake , Alert , and Oriented times 3; GCS 15 Head: Normal cephalic , atraumatic Eyes: Pupils equal , round, reactive to light Nose/Throat: No nasal drainage, patent upper airway without erythema or exudate.  Neck: Supple, Full range of motion, No anterior adenopathy or palpable thyroid masses Lungs: Clear to ascultation without wheezes , rhonchi, or rales Heart: Regular rate, regular rhythm without murmurs , gallops , or rubs Abdomen: Soft, non tender without rebound, guarding , or rigidity; bowel sounds positive and symmetric in all 4 quadrants. No organomegaly .        Extremities: 2 plus symmetric pulses. No edema, clubbing or cyanosis Neurologic: normal ambulation, Motor symmetric without deficits, sensory intact Skin: warm, dry, no rashes   Labs:   All laboratory work was reviewed including any pertinent negatives or positives listed below:  Labs Reviewed  BASIC METABOLIC PANEL - Abnormal; Notable for the following:    Glucose, Bld 102 (*)    Calcium 8.7 (*)    All other components within normal limits  URINALYSIS COMPLETEWITH MICROSCOPIC ([facility] ONLY) - Abnormal; Notable for the following:    Color, Urine YELLOW (*)    APPearance HAZY (*)    Leukocytes, UA 3+ (*)    Bacteria, UA RARE (*)    Squamous Epithelial / LPF 6-30 (*)    All  other components within normal limits  URINE CULTURE   examination of the urinalysis shows the patient does have a urinary tract infection.   Procedures: The patient was instructed by the nursing staff on how to self catheterize herself. Then advised to do it more frequently especially if she cannot produce any urine. Patient had a post void residual of 398 mL by ultrasound.       ED Course: Patient did spontaneously voided approximately 330 mL of urine while here in emergency department. She only has a small amount  of residual. Patient I felt could be treated on an outpatient basis now with the understanding on how to self catheterize herself and also went to perform it. Patient was also prescribed ciprofloxacin for what appears to be a urinary tract infection without clinical indications of pyelonephritis. Patient plans on traveling out of town she was advises to give her the information that if her urine culture is not covered with the prescribed Cipro that we could notify her to change the antibiotic. Patient was also given a prescription for Flomax with instructions on the medication.   Assessment: Urinary retention Recent renal colic Urinary tract infection  Final Clinical Impression:   Final diagnoses:  Acute cystitis without hematuria     Plan:  Patient was advised to return immediately if condition worsens. Patient was advised to follow up with her primary care physician or other specialized physicians involved and in their current assessment.              Daymon Larsen, MD 07/18/15 [phone]

## 2015-07-18 NOTE — ED Notes (Signed)
Printed off paperwork for pt on how to straight cath. Pt was not told by prescribing dr to clean herself prior to inserting the catheter. Pt only uses the catheter at night to empty her bladder, otherwise she urinates on her own the rest of the time.

## 2015-07-18 NOTE — ED Notes (Signed)
Pt sent from Urgent Care with urinary retention, pt is straight cathing, sent here for bladder scan and possible ultrasound

## 2015-07-18 NOTE — ED Provider Notes (Signed)
[facility] Emergency Department Provider Note  ____________________________________________  Time seen: Approximately 9:20 AM  I have reviewed the triage vital signs and the nursing notes.   HISTORY  Chief Complaint Urine Output  Denies need for language interpreter.   HPI Corri Apsey is a 39 y.o. female presents with complaints of urinary retention, right flank and suprapubic pain. Patient reports that she has a chronic history of kidney stones and reports that she had ureteroscopy 07/04/14 by urology Dr Erlene Quan due to right kidney stones and continued right flank pain. Reports stent was placed at that time then removed 3 days post procedure.  States this past week pain had resolved, then Friday she was seen in urology office as she was unable to void. Reports she was then taught to self cath and given in and out cath materials. Also reports she was directed to and stopped oxybutynin this week.   States yesterday she had onset of right flank pain that wraps to right suprapubic. States pain is a contact ache with intermittent sharp at 8/10. States also unable to fully empty bladder. States she is able to intermittently void but states unable to fully empty. States she then self caths but states only getting small amounts "like 10ccs" out and "still feels full." States last self cath last night at 1030. States voided small amount of urine this am.   Reports she is scheduled to travel to [place] tomorrow and wanted this addressed prior to traveling.   Denies fever, nausea, diarrhea, constipation, vaginal bleeding or vaginal discharge.    Past Medical History  Diagnosis Date  . Preterm labor   . Kidney stone   . Thyroid disease   . Hypothyroidism   . Anemia     H/O 20 YRS AGO  . Hypothyroidism     Patient Active Problem List   Diagnosis Date Noted  . Right ureteral stone 05/31/2015  . Uterine fibroid 05/31/2015  . GBS (group B Streptococcus carrier), +RV culture,  currently pregnant 08/13/2012  . Hemorrhoids complicating pregnancy or puerperium with antenatal complication 16/08/9603  . Marginal insertion of umbilical cord 54/07/8118  . Supervision of high-risk pregnancy 01/16/2012  . Uterine fibroids affecting pregnancy, antepartum 01/16/2012  . Maternal hypothyroidism, antepartum 01/16/2012  . Hypothyroidism 12/03/2010  . ALLERGIC RHINITIS 07/27/2009  . ANEMIA-IRON DEFICIENCY 05/06/2009  . 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 EXTERNAL HEMORRHOIDS 05/06/2009    Past Surgical History  Procedure Laterality Date  . Hemrrhoidectomy      age 52 or 89  . Cystoscopy/ureteroscopy/holmium laser/stent placement Right 07/05/2015    Procedure: CYSTOSCOPY/URETEROSCOPY/HOLMIUM LASER/STENT PLACEMENT;  Surgeon: Hollice Espy, MD;  Location: [facility] ORS;  Service: Urology;  Laterality: Right;    Current Outpatient Rx  Name  Route  Sig  Dispense  Refill  . levothyroxine (SYNTHROID, LEVOTHROID) 75 MCG tablet   Oral   Take 1 tablet (75 mcg total) by mouth daily.   30 tablet   0   . docusate sodium (COLACE) 100 MG capsule   Oral   Take 1 capsule (100 mg total) by mouth 2 (two) times daily.   60 capsule   0   . HYDROcodone-acetaminophen (NORCO/VICODIN) 5-325 MG per tablet   Oral   Take 1-2 tablets by mouth every 6 (six) hours as needed for moderate pain.   15 tablet   0   . oxybutynin (DITROPAN) 5 MG tablet   Oral   Take 1 tablet (5 mg total) by mouth every 8 (eight) hours  as needed for bladder spasms.   30 tablet   0     Allergies Oxybutynin and Oxycodone  Family History  Problem Relation Age of Onset  . Stroke Father   . Other Neg Hx     Social History Social History  Substance Use Topics  . Smoking status: Never Smoker   . Smokeless tobacco: Never Used  . Alcohol Use: No    Review of Systems Constitutional: No fever/chills Eyes: No visual changes. ENT: No sore throat. Cardiovascular: Denies chest pain. Respiratory: Denies  shortness of breath. Gastrointestinal: No abdominal pain.  No nausea, no vomiting.  No diarrhea.  No constipation. Genitourinary: Negative for dysuria. Urinary retention and right flank pain.  Musculoskeletal: Negative for back pain. Skin: Negative for rash. Neurological: Negative for headaches, focal weakness or numbness.  10-point ROS otherwise negative.  ____________________________________________   PHYSICAL EXAM:  VITAL SIGNS: ED Triage Vitals  Enc Vitals Group     BP 07/18/15 0829 121/78 mmHg     Pulse Rate 07/18/15 0829 64     Resp 07/18/15 0829 16     Temp 07/18/15 0829 98 F (36.7 C)     Temp Source 07/18/15 0829 Oral     SpO2 07/18/15 0829 100 %     Weight 07/18/15 0829 139 lb (63.05 kg)     Height 07/18/15 0829 5\' 2"  (1.575 m)     Head Cir --      Peak Flow --      Pain Score 07/18/15 0833 2     Pain Loc --      Pain Edu? --      Excl. in [place]? --     Constitutional: Alert and oriented. Well appearing and in no acute distress. Eyes: Conjunctivae are normal. PERRL. EOMI. Head: Atraumatic.   Nose: No congestion/rhinnorhea.  Mouth/Throat: Mucous membranes are moist.   Neck: No stridor.  No cervical spine tenderness to palpation. Hematological/Lymphatic/Immunilogical: No cervical lymphadenopathy. Cardiovascular: Normal rate, regular rhythm. Grossly normal heart sounds.  Good peripheral circulation. Respiratory: Normal respiratory effort.  No retractions. Lungs CTAB. Gastrointestinal:No distention. Normal Bowel sounds.Right CVA mild to mod TTP, left CVA nontender. Mild to mod suprapubic TTP.  Musculoskeletal: No lower or upper extremity tenderness nor edema.   Neurologic:  Normal speech and language. No gross focal neurologic deficits are appreciated. No gait instability. Skin:  Skin is warm, dry and intact. No rash noted. Psychiatric: Mood and affect are normal. Speech and behavior are normal.  ____________________________________________   LABS (all labs  ordered are listed, but only abnormal results are displayed)  Labs Reviewed - No data to display ____________________________________________  INITIAL IMPRESSION / ASSESSMENT AND PLAN / ED COURSE  Pertinent labs & imaging results that were available during my care of the patient were reviewed by me and considered in my medical decision making (see chart for details).  Discussed patient and plan of care with Dr Zenda Alpers who agreed with plan.   Very well appearing patient with chronic history of kidney stones. Recent ureteroscopy with stent placement and removal, also with recent difficulty voiding. Directed in self cath this past Friday. Presented today due to onset of acute right flank pain which wraps to suprapubic and difficulty emptying bladder. States bladder remaining full post self cath. Will discuss with Dr Jeffie Pollock urology.   0930:  Discussed with urology Dr Jeffie Pollock. Dr Jeffie Pollock also has seen patient outpatient, and reviewed recent encounter notes as well as procedure notes. Due to difficulty emptying bladder as  well as right flank pain, Dr Jeffie Pollock recommends patient to have renal ultrasound and urinalysis/culture. Recommends bladder scan as well. Dr Jeffie Pollock states concern for edema post procedure vs infection and recommends renal ultrasound for further evaluation at this time. Dr Jeffie Pollock recommends patient to be seen in ER for renal ultrasound and further evaluation.  Discussed Dr Ralene Muskrat recommendations with patient. Discussed that we could obtain urine at this time, but unable to perform renal ultrasound and bladder scan as we do not have ultrasound testing or ability to bladder scan available at this facility today. Patient would need to be seen in ER for renal ultrasound and further evaluation. Patient alert and oriented with decisional capacity and discussed with family. Patient reports she wants to go directly to ER and not have urinalysis or further management at this facility at this time. Patient  reports she will  transport self. Patient well appearing, afebrile, drove self to urgent care and has decided to transport self to ER. Discussed risks and benefits of  further evaluation vs not seeking further evaluation  As well as self transportation, and patient opts to transport self.  Discussed with patient to go directly to ER and also to follow up with urology closely. Patient verbalized understanding and agreed to plan.  ____________________________________________   FINAL CLINICAL IMPRESSION(S) / ED DIAGNOSES  Final diagnoses:  Urine retention  Right flank pain.      Marylene Land, NP 07/18/15 1057

## 2015-07-20 LAB — URINE CULTURE

## 2015-09-01 ENCOUNTER — Ambulatory Visit: Payer: 59

## 2015-09-17 ENCOUNTER — Encounter: Payer: Self-pay | Admitting: Family Medicine

## 2015-09-17 ENCOUNTER — Ambulatory Visit (INDEPENDENT_AMBULATORY_CARE_PROVIDER_SITE_OTHER): Payer: 59 | Admitting: Family Medicine

## 2015-09-17 VITALS — BP 110/60 | HR 62 | Ht 61.0 in | Wt 141.0 lb

## 2015-09-17 DIAGNOSIS — N3090 Cystitis, unspecified without hematuria: Secondary | ICD-10-CM

## 2015-09-17 DIAGNOSIS — E039 Hypothyroidism, unspecified: Secondary | ICD-10-CM

## 2015-09-17 DIAGNOSIS — Z23 Encounter for immunization: Secondary | ICD-10-CM

## 2015-09-17 LAB — POCT URINALYSIS DIPSTICK
BILIRUBIN UA: NEGATIVE
Glucose, UA: NEGATIVE
KETONES UA: NEGATIVE
NITRITE UA: NEGATIVE
PH UA: 6
Protein, UA: NEGATIVE
Spec Grav, UA: 1.01
Urobilinogen, UA: 0.2

## 2015-09-17 MED ORDER — SULFAMETHOXAZOLE-TRIMETHOPRIM 800-160 MG PO TABS: 1.0000 | ORAL_TABLET | Freq: Two times a day (BID) | ORAL | Status: DC

## 2015-09-17 MED ORDER — LEVOTHYROXINE SODIUM 75 MCG PO TABS: 75.0000 ug | ORAL_TABLET | Freq: Every day | ORAL | Status: DC

## 2015-09-17 NOTE — Patient Instructions (Signed)
Hypothyroidism Hypothyroidism is a disorder of the thyroid. The thyroid is a large gland that is located in the lower front of the neck. The thyroid releases hormones that control how the body works. With hypothyroidism, the thyroid does not make enough of these hormones. CAUSES Causes of hypothyroidism may include:  Viral infections.  Pregnancy.  Your own defense system (immune system) attacking your thyroid.  Certain medicines.  Birth defects.  Past radiation treatments to your head or neck.  Past treatment with radioactive iodine.  Past surgical removal of part or all of your thyroid.  Problems with the gland that is located in the center of your brain (pituitary). SIGNS AND SYMPTOMS Signs and symptoms of hypothyroidism may include:  Feeling as though you have no energy (lethargy).  Inability to tolerate cold.  Weight gain that is not explained by a change in diet or exercise habits.  Dry skin.  Coarse hair.  Menstrual irregularity.  Slowing of thought processes.  Constipation.  Sadness or depression. DIAGNOSIS  Your health care provider may diagnose hypothyroidism with blood tests and ultrasound tests. TREATMENT Hypothyroidism is treated with medicine that replaces the hormones that your body does not make. After you begin treatment, it may take several weeks for symptoms to go away. HOME CARE INSTRUCTIONS   Take medicines only as directed by your health care provider.  If you start taking any new medicines, tell your health care provider.  Keep all follow-up visits as directed by your health care provider. This is important. As your condition improves, your dosage needs may change. You will need to have blood tests regularly so that your health care provider can watch your condition. SEEK MEDICAL CARE IF:  Your symptoms do not get better with treatment.  You are taking thyroid replacement medicine and:  You sweat excessively.  You have tremors.  You  feel anxious.  You lose weight rapidly.  You cannot tolerate heat.  You have emotional swings.  You have diarrhea.  You feel weak. SEEK IMMEDIATE MEDICAL CARE IF:   You develop chest pain.  You develop an irregular heartbeat.  You develop a rapid heartbeat.   This information is not intended to replace advice given to you by your health care provider. Make sure you discuss any questions you have with your health care provider.   Document Released: 11/06/2005 Document Revised: 11/27/2014 Document Reviewed: 03/24/2014 Elsevier Interactive Patient Education 2016 [employer] Urinary Tract Infection Urinary tract infections (UTIs) can develop anywhere along your urinary tract. Your urinary tract is your body's drainage system for removing wastes and extra water. Your urinary tract includes two kidneys, two ureters, a bladder, and a urethra. Your kidneys are a pair of bean-shaped organs. Each kidney is about the size of your fist. They are located below your ribs, one on each side of your spine. CAUSES Infections are caused by microbes, which are microscopic organisms, including fungi, viruses, and bacteria. These organisms are so small that they can only be seen through a microscope. Bacteria are the microbes that most commonly cause UTIs. SYMPTOMS  Symptoms of UTIs may vary by age and gender of the patient and by the location of the infection. Symptoms in young women typically include a frequent and intense urge to urinate and a painful, burning feeling in the bladder or urethra during urination. Older women and men are more likely to be tired, shaky, and weak and have muscle aches and abdominal pain. A fever may mean the infection is in your  kidneys. Other symptoms of a kidney infection include pain in your back or sides below the ribs, nausea, and vomiting. DIAGNOSIS To diagnose a UTI, your caregiver will ask you about your symptoms. Your caregiver will also ask you to provide a urine  sample. The urine sample will be tested for bacteria and white blood cells. White blood cells are made by your body to help fight infection. TREATMENT  Typically, UTIs can be treated with medication. Because most UTIs are caused by a bacterial infection, they usually can be treated with the use of antibiotics. The choice of antibiotic and length of treatment depend on your symptoms and the type of bacteria causing your infection. HOME CARE INSTRUCTIONS  If you were prescribed antibiotics, take them exactly as your caregiver instructs you. Finish the medication even if you feel better after you have only taken some of the medication.  Drink enough water and fluids to keep your urine clear or pale yellow.  Avoid caffeine, tea, and carbonated beverages. They tend to irritate your bladder.  Empty your bladder often. Avoid holding urine for long periods of time.  Empty your bladder before and after sexual intercourse.  After a bowel movement, women should cleanse from front to back. Use each tissue only once. SEEK MEDICAL CARE IF:   You have back pain.  You develop a fever.  Your symptoms do not begin to resolve within 3 days. SEEK IMMEDIATE MEDICAL CARE IF:   You have severe back pain or lower abdominal pain.  You develop chills.  You have nausea or vomiting.  You have continued burning or discomfort with urination. MAKE SURE YOU:   Understand these instructions.  Will watch your condition.  Will get help right away if you are not doing well or get worse.   This information is not intended to replace advice given to you by your health care provider. Make sure you discuss any questions you have with your health care provider.   Document Released: 08/16/2005 Document Revised: 07/28/2015 Document Reviewed: 12/15/2011 Elsevier Interactive Patient Education [employer].

## 2015-09-17 NOTE — Progress Notes (Signed)
Name: [name]   MRN: [id number]    DOB: [birth year]   Date:09/17/2015       Progress Note  Subjective  Chief Complaint  Chief Complaint  Patient presents with  . Abdominal Pain  . Hypothyroidism    Abdominal Pain This is a new problem. The current episode started 1 to 4 weeks ago (2 weekks ago). The onset quality is gradual. The problem occurs daily. The problem has been waxing and waning. The pain is located in the suprapubic region. The pain is at a severity of 2/10. The quality of the pain is aching and burning. The abdominal pain does not radiate. Associated symptoms include dysuria and frequency. Pertinent negatives include no anorexia, arthralgias, belching, constipation, diarrhea, fever, flatus, headaches, hematochezia, hematuria, melena, myalgias, nausea, vomiting or weight loss. Nothing aggravates the pain. The pain is relieved by nothing. She has tried nothing for the symptoms. The treatment provided mild relief. Prior diagnostic workup includes CT scan and surgery (cystocopy).  Thyroid Problem Presents for follow-up visit. Symptoms include cold intolerance and fatigue. Patient reports no anxiety, constipation, depressed mood, diaphoresis, diarrhea, dry skin, hair loss, heat intolerance, hoarse voice, leg swelling, menstrual problem, nail problem, palpitations, tremors, visual change, weight gain or weight loss. The symptoms have been stable. Past treatments include levothyroxine. The treatment provided moderate relief. There is no history of atrial fibrillation, dementia, diabetes, Graves' ophthalmopathy, heart failure, hyperlipidemia, neuropathy, obesity or osteopenia. There are no known risk factors.    No problem-specific assessment & plan notes found for this encounter.   Past Medical History  Diagnosis Date  . Preterm labor   . Kidney stone   . Thyroid disease   . Hypothyroidism   . Anemia     H/O 20 YRS AGO  . Hypothyroidism     Past Surgical History  Procedure  Laterality Date  . Hemrrhoidectomy      age 39 or 39  . Cystoscopy/ureteroscopy/holmium laser/stent placement Right 07/05/2015    Procedure: CYSTOSCOPY/URETEROSCOPY/HOLMIUM LASER/STENT PLACEMENT;  Surgeon: Hollice Espy, MD;  Location: [facility] ORS;  Service: Urology;  Laterality: Right;    Family History  Problem Relation Age of Onset  . Stroke Father   . Other Neg Hx     Social History   Social History  . Marital Status: Married    Spouse Name: N/A  . Number of Children: N/A  . Years of Education: N/A   Occupational History  . Not on file.   Social History Main Topics  . Smoking status: Never Smoker   . Smokeless tobacco: Never Used  . Alcohol Use: No  . Drug Use: No  . Sexual Activity:    Partners: Male    Birth Control/ Protection: Condom   Other Topics Concern  . Not on file   Social History Narrative    Allergies  Allergen Reactions  . Oxybutynin Other (See Comments)    Bleeding gums and urinary retention  . Oxycodone Nausea And Vomiting     Review of Systems  Constitutional: Positive for fatigue. Negative for fever, chills, weight loss, weight gain, malaise/fatigue and diaphoresis.  HENT: Negative for ear discharge, ear pain, hoarse voice and sore throat.   Eyes: Negative for blurred vision.  Respiratory: Negative for cough, sputum production, shortness of breath and wheezing.   Cardiovascular: Negative for chest pain, palpitations and leg swelling.  Gastrointestinal: Positive for abdominal pain. Negative for heartburn, nausea, vomiting, diarrhea, constipation, blood in stool, melena, hematochezia, anorexia and flatus.  Genitourinary: Positive for  dysuria and frequency. Negative for urgency, hematuria and menstrual problem.  Musculoskeletal: Negative for myalgias, back pain, joint pain, arthralgias and neck pain.  Skin: Negative for rash.  Neurological: Negative for dizziness, tingling, tremors, sensory change, focal weakness and headaches.   Endo/Heme/Allergies: Positive for cold intolerance. Negative for environmental allergies, heat intolerance and polydipsia. Does not bruise/bleed easily.  Psychiatric/Behavioral: Negative for depression and suicidal ideas. The patient is not nervous/anxious and does not have insomnia.      Objective  Filed Vitals:   09/17/15 1012  BP: 110/60  Pulse: 62  Height: 5\' 1"  (1.549 m)  Weight: 141 lb (63.957 kg)    Physical Exam  Constitutional: She is well-developed, well-nourished, and in no distress. No distress.  HENT:  Head: Normocephalic and atraumatic.  Right Ear: External ear normal.  Left Ear: External ear normal.  Nose: Nose normal.  Mouth/Throat: Oropharynx is clear and moist.  Eyes: Conjunctivae and EOM are normal. Pupils are equal, round, and reactive to light. Right eye exhibits no discharge. Left eye exhibits no discharge.  Neck: Normal range of motion. Neck supple. No JVD present. No thyromegaly present.  Cardiovascular: Normal rate, regular rhythm, normal heart sounds and intact distal pulses.  Exam reveals no gallop and no friction rub.   No murmur heard. Pulmonary/Chest: Effort normal and breath sounds normal.  Abdominal: Soft. Bowel sounds are normal. She exhibits no mass. There is no tenderness. There is no guarding.  Musculoskeletal: Normal range of motion. She exhibits no edema.  Lymphadenopathy:    She has no cervical adenopathy.  Neurological: She is alert. She has normal reflexes.  Skin: Skin is warm and dry. She is not diaphoretic.  Psychiatric: Mood and affect normal.      Assessment & Plan  Problem List Items Addressed This Visit      Endocrine   Hypothyroidism - Primary   Relevant Medications   levothyroxine (SYNTHROID, LEVOTHROID) 75 MCG tablet   Other Relevant Orders   TSH    Other Visit Diagnoses    Cystitis        Relevant Orders    POCT Urinalysis Dipstick         Dr. Otilio Miu [facility]  09/17/2015

## 2015-09-18 LAB — TSH: TSH: 21.11 u[IU]/mL — ABNORMAL HIGH (ref 0.450–4.500)

## 2015-09-20 MED ORDER — LEVOTHYROXINE SODIUM 88 MCG PO TABS: 88.0000 ug | ORAL_TABLET | Freq: Every day | ORAL | Status: DC

## 2015-09-20 NOTE — Addendum Note (Signed)
Addended by: Fredderick Severance on: 09/20/2015 03:34 PM   Modules accepted: Orders, Medications

## 2015-11-01 ENCOUNTER — Ambulatory Visit: Payer: 59

## 2015-11-01 ENCOUNTER — Ambulatory Visit (INDEPENDENT_AMBULATORY_CARE_PROVIDER_SITE_OTHER): Payer: 59 | Admitting: Family Medicine

## 2015-11-01 ENCOUNTER — Encounter: Payer: Self-pay | Admitting: Family Medicine

## 2015-11-01 VITALS — BP 120/80 | HR 72 | Ht 61.0 in | Wt 141.0 lb

## 2015-11-01 DIAGNOSIS — R42 Dizziness and giddiness: Secondary | ICD-10-CM | POA: Diagnosis not present

## 2015-11-01 DIAGNOSIS — N938 Other specified abnormal uterine and vaginal bleeding: Secondary | ICD-10-CM

## 2015-11-01 DIAGNOSIS — D649 Anemia, unspecified: Secondary | ICD-10-CM | POA: Diagnosis not present

## 2015-11-01 DIAGNOSIS — E039 Hypothyroidism, unspecified: Secondary | ICD-10-CM | POA: Diagnosis not present

## 2015-11-01 NOTE — Progress Notes (Signed)
Name: [name]   MRN: [id number]    DOB: [birth year]   Date:11/01/2015       Progress Note  Subjective  Chief Complaint  Chief Complaint  Patient presents with  . Menstrual Problem    missed period x 2 months- has been on period x 10 days- refer to GYN  . Hypothyroidism    needs TSH rechecked    Gynecologic Exam The patient's primary symptoms include vaginal bleeding. The patient's pertinent negatives include no genital itching, genital lesions, genital odor, genital rash, missed menses, pelvic pain or vaginal discharge. This is a new problem. The current episode started 1 to 4 weeks ago (10 days). The problem occurs daily. The problem has been unchanged (spotting/ 2ppd). Associated symptoms include hematuria. Pertinent negatives include no abdominal pain, anorexia, back pain, chills, constipation, diarrhea, dysuria, fever, flank pain, frequency, headaches, joint pain, nausea, rash, sore throat or urgency. The vaginal bleeding is spotting. She has not been passing clots. She has not been passing tissue. Nothing aggravates the symptoms. She has tried nothing for the symptoms. She is sexually active. She uses nothing for contraception. Her menstrual history has been irregular (prior to surgery). Her past medical history is significant for a gynecological surgery and miscarriage. There is no history of endometriosis, metrorrhagia, PID or an STD.  Thyroid Problem Presents for follow-up visit. Symptoms include menstrual problem. Patient reports no anxiety, cold intolerance, constipation, depressed mood, diaphoresis, diarrhea, dry skin, fatigue, hair loss, heat intolerance, hoarse voice, leg swelling, nail problem, palpitations, tremors, visual change, weight gain or weight loss. The symptoms have been improving. Past treatments include levothyroxine. The treatment provided mild relief.    No problem-specific assessment & plan notes found for this encounter.   Past Medical History  Diagnosis Date  .  Preterm labor   . Kidney stone   . Thyroid disease   . Hypothyroidism   . Anemia     H/O 20 YRS AGO  . Hypothyroidism     Past Surgical History  Procedure Laterality Date  . Hemrrhoidectomy      age 78 or 20  . Cystoscopy/ureteroscopy/holmium laser/stent placement Right 07/05/2015    Procedure: CYSTOSCOPY/URETEROSCOPY/HOLMIUM LASER/STENT PLACEMENT;  Surgeon: Hollice Espy, MD;  Location: [facility];  Service: Urology;  Laterality: Right;    Family History  Problem Relation Age of Onset  . Stroke Father   . Other Neg Hx     Social History   Social History  . Marital Status: Married    Spouse Name: N/A  . Number of Children: N/A  . Years of Education: N/A   Occupational History  . Not on file.   Social History Main Topics  . Smoking status: Never Smoker   . Smokeless tobacco: Never Used  . Alcohol Use: No  . Drug Use: No  . Sexual Activity:    Partners: Male    Birth Control/ Protection: Condom   Other Topics Concern  . Not on file   Social History Narrative    Allergies  Allergen Reactions  . Oxybutynin Other (See Comments)    Bleeding gums and urinary retention  . Oxycodone Nausea And Vomiting     Review of Systems  Constitutional: Negative for fever, chills, weight loss, weight gain, malaise/fatigue, diaphoresis and fatigue.  HENT: Negative for ear discharge, ear pain, hoarse voice and sore throat.   Eyes: Negative for blurred vision.  Respiratory: Negative for cough, sputum production, shortness of breath and wheezing.   Cardiovascular: Negative for chest pain,  palpitations and leg swelling.  Gastrointestinal: Negative for heartburn, nausea, abdominal pain, diarrhea, constipation, blood in stool, melena and anorexia.  Genitourinary: Positive for hematuria and menstrual problem. Negative for dysuria, urgency, frequency, flank pain, vaginal discharge, pelvic pain and missed menses.  Musculoskeletal: Negative for myalgias, back pain, joint pain and neck  pain.  Skin: Negative for rash.  Neurological: Negative for dizziness, tingling, tremors, sensory change, focal weakness and headaches.  Endo/Heme/Allergies: Negative for environmental allergies, cold intolerance, heat intolerance and polydipsia. Does not bruise/bleed easily.  Psychiatric/Behavioral: Negative for depression and suicidal ideas. The patient is not nervous/anxious and does not have insomnia.      Objective  Filed Vitals:   11/01/15 1336  BP: 120/80  Pulse: 72  Height: 5\' 1"  (1.549 m)  Weight: 141 lb (63.957 kg)    Physical Exam  Constitutional: She is well-developed, well-nourished, and in no distress. No distress.  HENT:  Head: Normocephalic and atraumatic.  Right Ear: External ear normal.  Left Ear: External ear normal.  Nose: Nose normal.  Mouth/Throat: Oropharynx is clear and moist.  Eyes: Conjunctivae and EOM are normal. Pupils are equal, round, and reactive to light. Right eye exhibits no discharge. Left eye exhibits no discharge.  Neck: Normal range of motion. Neck supple. No JVD present. No thyromegaly present.  Cardiovascular: Normal rate, regular rhythm, normal heart sounds and intact distal pulses.  Exam reveals no gallop and no friction rub.   No murmur heard. Pulmonary/Chest: Effort normal and breath sounds normal.  Abdominal: Soft. Bowel sounds are normal. She exhibits no mass. There is no tenderness. There is no guarding.  Musculoskeletal: Normal range of motion. She exhibits no edema.  Lymphadenopathy:    She has no cervical adenopathy.  Neurological: She is alert. She has normal reflexes.  Skin: Skin is warm and dry. She is not diaphoretic.  Psychiatric: Mood and affect normal.  Nursing note and vitals reviewed.     Assessment & Plan  Problem List Items Addressed This Visit    None    Visit Diagnoses    Dysfunctional uterine bleeding    -  Primary    Relevant Orders    Ambulatory referral to Gynecology    Dizzinesses        improving     Relevant Orders    Hemoglobin    Hypothyroidism, unspecified hypothyroidism type        Relevant Orders    TSH    Anemia, unspecified anemia type        Relevant Orders    Hemoglobin         Dr. Otilio Miu [facility]  11/01/2015

## 2015-11-02 LAB — HEMOGLOBIN: Hemoglobin: 12.1 g/dL (ref 11.1–15.9)

## 2015-11-02 LAB — TSH: TSH: 1.39 u[IU]/mL (ref 0.450–4.500)

## 2015-11-20 ENCOUNTER — Other Ambulatory Visit: Payer: Self-pay | Admitting: Family Medicine

## 2015-11-23 ENCOUNTER — Other Ambulatory Visit: Payer: Self-pay

## 2015-11-23 DIAGNOSIS — E039 Hypothyroidism, unspecified: Secondary | ICD-10-CM

## 2015-11-23 MED ORDER — LEVOTHYROXINE SODIUM 88 MCG PO TABS: 88.0000 ug | ORAL_TABLET | Freq: Every day | ORAL | Status: DC

## 2015-11-29 ENCOUNTER — Ambulatory Visit: Payer: 59 | Admitting: Family Medicine

## 2015-12-04 IMAGING — CR DG ABDOMEN 1V
1 series · 1 of 1 positions shown · non-contrast
Comparison: Radiographs 06/28/2015 and 06/16/2015.  CT 05/09/2015.

CLINICAL DATA: Stabbing back and abdominal pain radiating into the
suprapubic area for 1-4 weeks. History of kidney stones. Initial
encounter.

EXAM:
ABDOMEN - 1 VIEW

[abdomen kub]
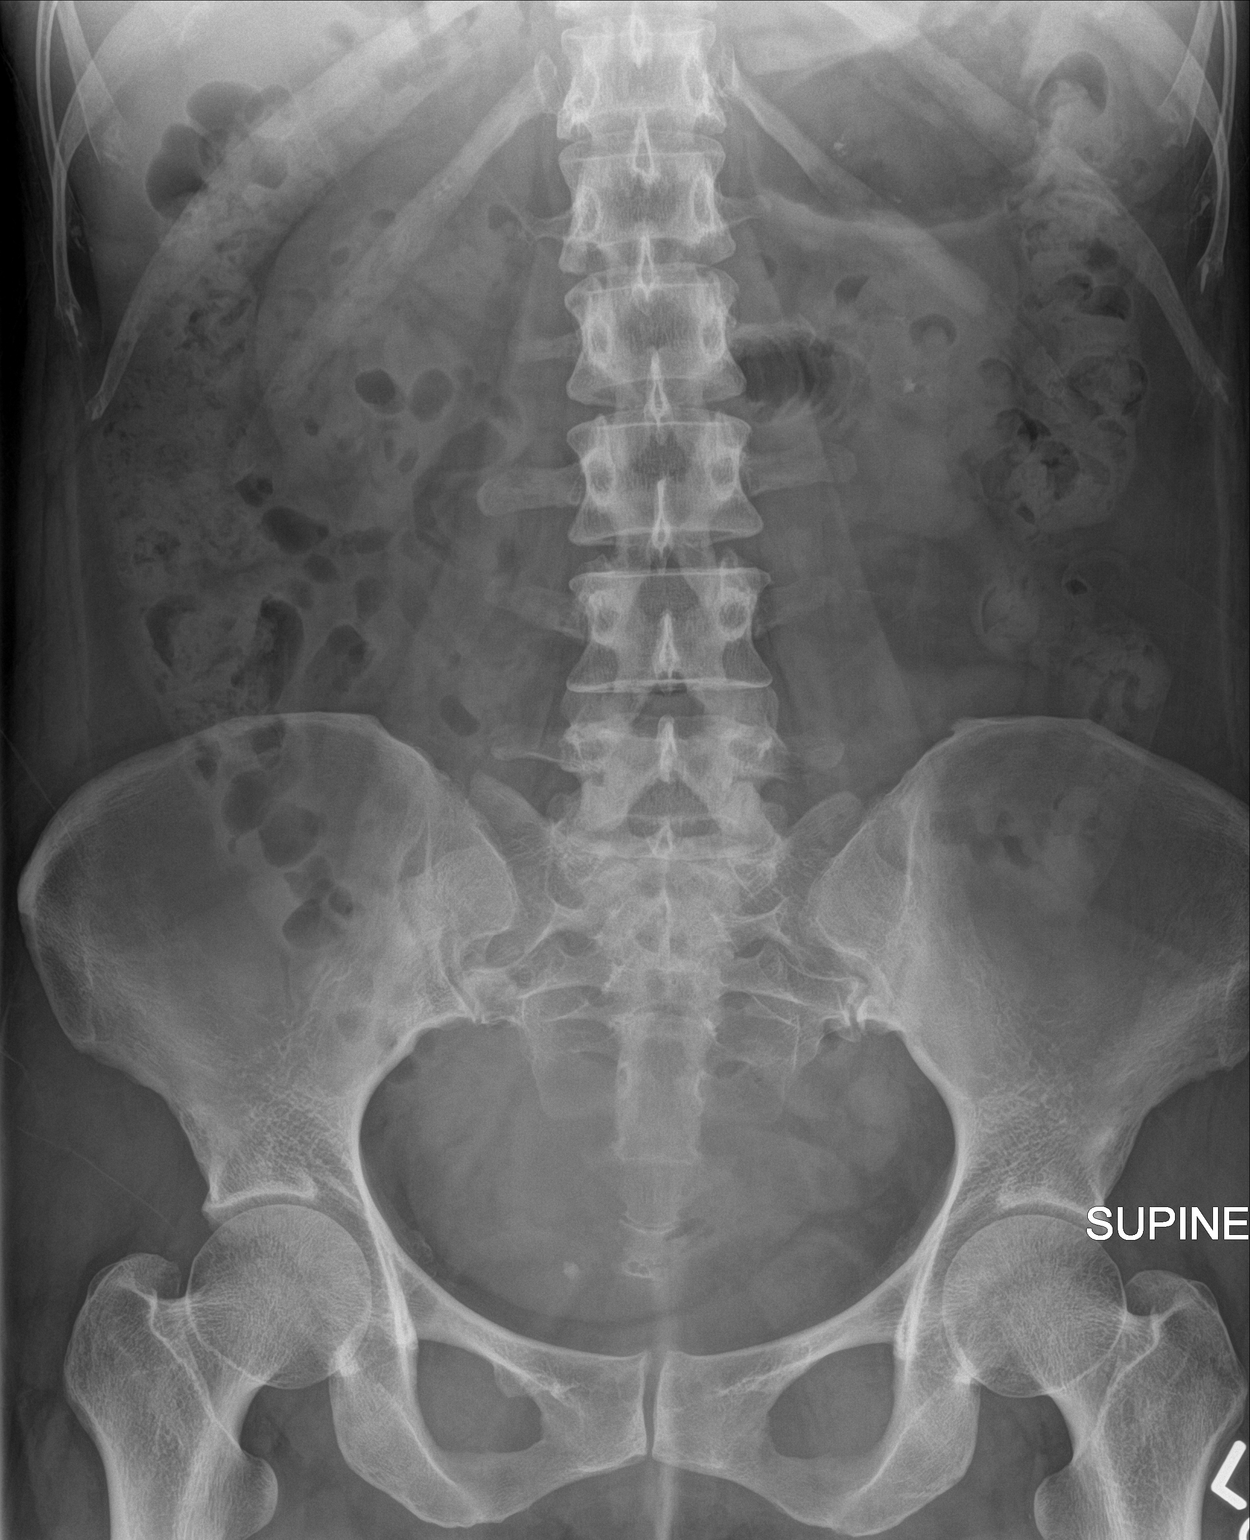

[1 of 1 positions shown; findings below may reference images not displayed]

FINDINGS: Bilateral renal calculi are grossly stable, partly obscured on the
right by bowel gas. No new calcifications are seen along the
expected course of the ureters. There is a persistent 5 mm right
pelvic calcification, shown to represent a distal ureteral calculus
on prior CT. The visualized bowel gas pattern is normal. The bones
appear unchanged.
IMPRESSION: No significant change in persistent distal right ureteral calculus
and bilateral nephrolithiasis.

## 2016-01-25 ENCOUNTER — Other Ambulatory Visit: Payer: Self-pay

## 2016-01-25 DIAGNOSIS — E039 Hypothyroidism, unspecified: Secondary | ICD-10-CM

## 2016-01-25 MED ORDER — LEVOTHYROXINE SODIUM 88 MCG PO TABS: 88.0000 ug | ORAL_TABLET | Freq: Every day | ORAL | Status: DC

## 2016-03-27 ENCOUNTER — Other Ambulatory Visit: Payer: Self-pay

## 2016-03-27 DIAGNOSIS — E039 Hypothyroidism, unspecified: Secondary | ICD-10-CM

## 2016-03-27 MED ORDER — LEVOTHYROXINE SODIUM 88 MCG PO TABS: 88.0000 ug | ORAL_TABLET | Freq: Every day | ORAL | Status: DC

## 2016-04-27 ENCOUNTER — Other Ambulatory Visit: Payer: Self-pay

## 2016-04-27 DIAGNOSIS — E039 Hypothyroidism, unspecified: Secondary | ICD-10-CM

## 2016-04-27 MED ORDER — LEVOTHYROXINE SODIUM 88 MCG PO TABS: 88.0000 ug | ORAL_TABLET | Freq: Every day | ORAL | Status: DC

## 2016-05-15 ENCOUNTER — Ambulatory Visit (INDEPENDENT_AMBULATORY_CARE_PROVIDER_SITE_OTHER): Payer: Self-pay | Admitting: Family Medicine

## 2016-05-15 ENCOUNTER — Encounter: Payer: Self-pay | Admitting: Family Medicine

## 2016-05-15 VITALS — BP 100/60 | HR 72 | Ht 61.0 in | Wt 130.0 lb

## 2016-05-15 DIAGNOSIS — E039 Hypothyroidism, unspecified: Secondary | ICD-10-CM

## 2016-05-15 DIAGNOSIS — F419 Anxiety disorder, unspecified: Secondary | ICD-10-CM

## 2016-05-15 DIAGNOSIS — D509 Iron deficiency anemia, unspecified: Secondary | ICD-10-CM

## 2016-05-15 DIAGNOSIS — F4322 Adjustment disorder with anxiety: Secondary | ICD-10-CM

## 2016-05-15 DIAGNOSIS — K219 Gastro-esophageal reflux disease without esophagitis: Secondary | ICD-10-CM | POA: Insufficient documentation

## 2016-05-15 DIAGNOSIS — R1011 Right upper quadrant pain: Secondary | ICD-10-CM

## 2016-05-15 LAB — HEMOCCULT GUIAC POC 1CARD (OFFICE): FECAL OCCULT BLD: NEGATIVE

## 2016-05-15 MED ORDER — RANITIDINE HCL 150 MG PO TABS: 150.0000 mg | ORAL_TABLET | Freq: Two times a day (BID) | ORAL | Status: DC

## 2016-05-15 MED ORDER — LEVOTHYROXINE SODIUM 88 MCG PO TABS: 88.0000 ug | ORAL_TABLET | Freq: Every day | ORAL | Status: AC

## 2016-05-15 NOTE — Progress Notes (Signed)
Name: [name]   MRN: [id number]    DOB: 1976/01/18   Date:05/15/2016       Progress Note  Subjective  Chief Complaint  Chief Complaint  Patient presents with  . Hypothyroidism    Thyroid Problem Presents for follow-up visit. Symptoms include anxiety, dry skin, fatigue and weight loss. Patient reports no cold intolerance, constipation, depressed mood, diaphoresis, diarrhea, hair loss, heat intolerance, hoarse voice, leg swelling, menstrual problem, nail problem, palpitations, tremors, visual change or weight gain. The symptoms have been stable.  Gastroesophageal Reflux She complains of abdominal pain, belching and heartburn. She reports no chest pain, no choking, no coughing, no dysphagia, no early satiety, no globus sensation, no hoarse voice, no nausea, no sore throat, no stridor, no tooth decay, no water brash or no wheezing. told looks jaudice. This is a new problem. The current episode started more than 1 month ago. The problem occurs frequently. The problem has been gradually improving. The heartburn is located in the substernum. The heartburn is of mild intensity. Nothing aggravates the symptoms. Associated symptoms include fatigue and weight loss. Pertinent negatives include no melena. There are no known risk factors. The treatment provided mild relief.  Anxiety Presents for initial visit. Symptoms include excessive worry and nervous/anxious behavior. Patient reports no chest pain, depressed mood, dizziness, dry mouth, insomnia, irritability, nausea, palpitations, shortness of breath or suicidal ideas. Primary symptoms comment: told looks jaudice. Symptoms occur most days. Nothing aggravates the symptoms.    Abdominal Pain This is a new problem. The current episode started more than 1 month ago (3 months). The onset quality is gradual. The problem occurs daily. The problem has been waxing and waning. The pain is located in the RUQ. The pain is mild. The abdominal pain does not radiate.  Associated symptoms include belching and weight loss. Pertinent negatives include no constipation, diarrhea, dysuria, fever, frequency, headaches, hematuria, melena, myalgias or nausea. The pain is relieved by nothing. She has tried nothing for the symptoms. Her past medical history is significant for GERD.    No problem-specific assessment & plan notes found for this encounter.   Past Medical History  Diagnosis Date  . Preterm labor   . Kidney stone   . Thyroid disease   . Hypothyroidism   . Anemia     H/O 20 YRS AGO  . Hypothyroidism     Past Surgical History  Procedure Laterality Date  . Hemrrhoidectomy      age 76 or 77  . Cystoscopy/ureteroscopy/holmium laser/stent placement Right 07/05/2015    Procedure: CYSTOSCOPY/URETEROSCOPY/HOLMIUM LASER/STENT PLACEMENT;  Surgeon: Hollice Espy, MD;  Location: [facility];  Service: Urology;  Laterality: Right;    Family History  Problem Relation Age of Onset  . Stroke Father   . Other Neg Hx     Social History   Social History  . Marital Status: Married    Spouse Name: N/A  . Number of Children: N/A  . Years of Education: N/A   Occupational History  . Not on file.   Social History Main Topics  . Smoking status: Never Smoker   . Smokeless tobacco: Never Used  . Alcohol Use: No  . Drug Use: No  . Sexual Activity:    Partners: Male    Birth Control/ Protection: Condom   Other Topics Concern  . Not on file   Social History Narrative    Allergies  Allergen Reactions  . Oxybutynin Other (See Comments)    Bleeding gums and urinary retention  .  Oxycodone Nausea And Vomiting     Review of Systems  Constitutional: Positive for weight loss and fatigue. Negative for fever, chills, weight gain, malaise/fatigue, diaphoresis and irritability.  HENT: Negative for ear discharge, ear pain, hoarse voice and sore throat.   Eyes: Negative for blurred vision.  Respiratory: Negative for cough, sputum production, choking,  shortness of breath and wheezing.   Cardiovascular: Negative for chest pain, palpitations and leg swelling.  Gastrointestinal: Positive for heartburn and abdominal pain. Negative for dysphagia, nausea, diarrhea, constipation, blood in stool and melena.  Genitourinary: Negative for dysuria, urgency, frequency, hematuria and menstrual problem.  Musculoskeletal: Negative for myalgias, back pain, joint pain and neck pain.  Skin: Negative for rash.  Neurological: Negative for dizziness, tingling, tremors, sensory change, focal weakness and headaches.  Endo/Heme/Allergies: Negative for environmental allergies, cold intolerance, heat intolerance and polydipsia. Does not bruise/bleed easily.  Psychiatric/Behavioral: Negative for depression and suicidal ideas. The patient is nervous/anxious. The patient does not have insomnia.      Objective  Filed Vitals:   05/15/16 1124  BP: 100/60  Pulse: 72  Height: 5\' 1"  (1.549 m)  Weight: 130 lb (58.968 kg)    Physical Exam  Constitutional: She is well-developed, well-nourished, and in no distress.  Eyes: EOM are normal. Pupils are equal, round, and reactive to light.  Neck: Neck supple. No thyromegaly present.  Cardiovascular: Normal rate and regular rhythm.   Pulmonary/Chest: Effort normal and breath sounds normal. She has no wheezes.  Abdominal: Soft. Bowel sounds are normal. She exhibits no mass. There is no hepatosplenomegaly. There is tenderness in the right upper quadrant. There is no rebound and no guarding.  Musculoskeletal: She exhibits no tenderness.  Lymphadenopathy:    She has no cervical adenopathy.  Skin: No rash noted. No erythema. There is pallor.  Psychiatric: Mood and affect normal.  Nursing note and vitals reviewed.     Assessment & Plan  Problem List Items Addressed This Visit      Digestive   Esophageal reflux   Relevant Medications   ranitidine (ZANTAC) 150 MG tablet   Other Relevant Orders   H. pylori antibody, IgG    POCT Occult Blood Stool (Completed)     Endocrine   Hypothyroidism - Primary   Relevant Medications   levothyroxine (SYNTHROID, LEVOTHROID) 88 MCG tablet   Other Relevant Orders   TSH     Other   ANEMIA-IRON DEFICIENCY   Relevant Orders   CBC   POCT Occult Blood Stool (Completed)   Acute anxiety   Adjustment disorder with anxious mood   Right upper quadrant pain   Relevant Medications   ranitidine (ZANTAC) 150 MG tablet   Other Relevant Orders   Hepatic Function Panel (6)   Acute Hep Panel & Hep B Surface Ab   US Abdomen Limited RUQ   H. pylori antibody, IgG   POCT Occult Blood Stool (Completed)        Dr. Deanna Jones [facility]  05/15/2016

## 2016-05-15 NOTE — Patient Instructions (Addendum)
Generalized Anxiety Disorder Generalized anxiety disorder (GAD) is a mental disorder. It interferes with life functions, including relationships, work, and school. GAD is different from normal anxiety, which everyone experiences at some point in their lives in response to specific life events and activities. Normal anxiety actually helps [place] prepare for and get through these life events and activities. Normal anxiety goes away after the event or activity is over.  GAD causes anxiety that is not necessarily related to specific events or activities. It also causes excess anxiety in proportion to specific events or activities. The anxiety associated with GAD is also difficult to control. GAD can vary from mild to severe. People with severe GAD can have intense waves of anxiety with physical symptoms (panic attacks).  SYMPTOMS The anxiety and worry associated with GAD are difficult to control. This anxiety and worry are related to many life events and activities and also occur more days than not for 6 months or longer. People with GAD also have three or more of the following symptoms (one or more in children):  Restlessness.   Fatigue.  Difficulty concentrating.   Irritability.  Muscle tension.  Difficulty sleeping or unsatisfying sleep. DIAGNOSIS GAD is diagnosed through an assessment by your health care provider. Your health care provider will ask you questions aboutyour mood,physical symptoms, and events in your life. Your health care provider may ask you about your medical history and use of alcohol or drugs, including prescription medicines. Your health care provider may also do a physical exam and blood tests. Certain medical conditions and the use of certain substances can cause symptoms similar to those associated with GAD. Your health care provider may refer you to a mental health specialist for further evaluation. TREATMENT The following therapies are usually used to treat GAD:    Medication. Antidepressant medication usually is prescribed for long-term daily control. Antianxiety medicines may be added in severe cases, especially when panic attacks occur.   Talk therapy (psychotherapy). Certain types of talk therapy can be helpful in treating GAD by providing support, education, and guidance. A form of talk therapy called cognitive behavioral therapy can teach you healthy ways to think about and react to daily life events and activities.  Stress managementtechniques. These include yoga, meditation, and exercise and can be very helpful when they are practiced regularly. A mental health specialist can help determine which treatment is best for you. Some people see improvement with one therapy. However, other people require a combination of therapies.   This information is not intended to replace advice given to you by your health care provider. Make sure you discuss any questions you have with your health care provider.   Document Released: 03/03/2013 Document Revised: 11/27/2014 Document Reviewed: 03/03/2013 Elsevier Interactive Patient Education 2016 [employer] Gastrointestinal Bleeding Gastrointestinal (GI) bleeding means there is bleeding somewhere along the digestive tract, between the mouth and anus. CAUSES  There are many different problems that can cause GI bleeding. Possible causes include:  Esophagitis. This is inflammation, irritation, or swelling of the esophagus.  Hemorrhoids.These are veins that are full of blood (engorged) in the rectum. They cause pain, inflammation, and may bleed.  Anal fissures.These are areas of painful tearing which may bleed. They are often caused by passing hard stool.  Diverticulosis.These are pouches that form on the colon over time, with age, and may bleed significantly.  Diverticulitis.This is inflammation in areas with diverticulosis. It can cause pain, fever, and bloody stools, although bleeding is  rare.  Polyps and  cancer. Colon cancer often starts out as precancerous polyps.  Gastritis and ulcers.Bleeding from the upper gastrointestinal tract (near the stomach) may travel through the intestines and produce black, sometimes tarry, often bad smelling stools. In certain cases, if the bleeding is fast enough, the stools may not be black, but red. This condition may be life-threatening. SYMPTOMS   Vomiting bright red blood or material that looks like coffee grounds.  Bloody, black, or tarry stools. DIAGNOSIS  Your caregiver may diagnose your condition by taking your history and performing a physical exam. More tests may be needed, including:  X-rays and other imaging tests.  Esophagogastroduodenoscopy (EGD). This test uses a flexible, lighted tube to look at your esophagus, stomach, and small intestine.  Colonoscopy. This test uses a flexible, lighted tube to look at your colon. TREATMENT  Treatment depends on the cause of your bleeding.   For bleeding from the esophagus, stomach, small intestine, or colon, the caregiver doing your EGD or colonoscopy may be able to stop the bleeding as part of the procedure.  Inflammation or infection of the colon can be treated with medicines.  Many rectal problems can be treated with creams, suppositories, or warm baths.  Surgery is sometimes needed.  Blood transfusions are sometimes needed if you have lost a lot of blood. If bleeding is slow, you may be allowed to go home. If there is a lot of bleeding, you will need to stay in the hospital for observation. HOME CARE INSTRUCTIONS   Take any medicines exactly as prescribed.  Keep your stools soft by eating foods that are high in fiber. These foods include whole grains, legumes, fruits, and vegetables. Prunes (1 to 3 a day) work well for many people.  Drink enough fluids to keep your urine clear or pale yellow. SEEK IMMEDIATE MEDICAL CARE IF:   Your bleeding increases.  You feel  lightheaded, weak, or you faint.  You have severe cramps in your back or abdomen.  You pass large blood clots in your stool.  Your problems are getting worse. MAKE SURE YOU:   Understand these instructions.  Will watch your condition.  Will get help right away if you are not doing well or get worse.   This information is not intended to replace advice given to you by your health care provider. Make sure you discuss any questions you have with your health care provider.   Document Released: 11/03/2000 Document Revised: 10/23/2012 Document Reviewed: 04/26/2015 Elsevier Interactive Patient Education [employer].

## 2016-05-16 ENCOUNTER — Other Ambulatory Visit: Payer: Self-pay

## 2016-05-16 ENCOUNTER — Telehealth: Payer: Self-pay

## 2016-05-16 DIAGNOSIS — A048 Other specified bacterial intestinal infections: Secondary | ICD-10-CM

## 2016-05-16 DIAGNOSIS — D649 Anemia, unspecified: Secondary | ICD-10-CM

## 2016-05-16 LAB — CBC
HEMATOCRIT: 22.2 % — AB (ref 34.0–46.6)
HEMOGLOBIN: 5.9 g/dL — AB (ref 11.1–15.9)
MCH: 16.3 pg — ABNORMAL LOW (ref 26.6–33.0)
MCHC: 26.6 g/dL — AB (ref 31.5–35.7)
MCV: 62 fL — ABNORMAL LOW (ref 79–97)
Platelets: 380 10*3/uL — ABNORMAL HIGH (ref 150–379)
RBC: 3.61 x10E6/uL — AB (ref 3.77–5.28)
RDW: 19.2 % — ABNORMAL HIGH (ref 12.3–15.4)
WBC: 4.9 10*3/uL (ref 3.4–10.8)

## 2016-05-16 LAB — TSH: TSH: 0.944 u[IU]/mL (ref 0.450–4.500)

## 2016-05-16 LAB — HEPATIC FUNCTION PANEL (6)
ALBUMIN: 4.5 g/dL (ref 3.5–5.5)
ALK PHOS: 42 IU/L (ref 39–117)
ALT: 8 IU/L (ref 0–32)
AST: 13 IU/L (ref 0–40)
BILIRUBIN TOTAL: 0.4 mg/dL (ref 0.0–1.2)
BILIRUBIN, DIRECT: 0.1 mg/dL (ref 0.00–0.40)

## 2016-05-16 LAB — H. PYLORI ANTIBODY, IGG: H PYLORI IGG: 6.9 U/mL — AB (ref 0.0–0.8)

## 2016-05-16 MED ORDER — FERROUS SULFATE 325 (65 FE) MG PO TABS: 325.0000 mg | ORAL_TABLET | Freq: Every day | ORAL | Status: AC

## 2016-05-16 MED ORDER — CLARITHROMYCIN 250 MG PO TABS: 250.0000 mg | ORAL_TABLET | Freq: Two times a day (BID) | ORAL | Status: DC

## 2016-05-16 MED ORDER — AMOXICILLIN 500 MG PO CAPS: 500.0000 mg | ORAL_CAPSULE | Freq: Two times a day (BID) | ORAL | Status: DC

## 2016-05-16 NOTE — Telephone Encounter (Signed)
Sent to [facility]

## 2016-05-22 ENCOUNTER — Ambulatory Visit: Payer: Self-pay

## 2016-07-04 ENCOUNTER — Encounter: Payer: Self-pay | Admitting: Family Medicine

## 2016-07-04 ENCOUNTER — Ambulatory Visit (INDEPENDENT_AMBULATORY_CARE_PROVIDER_SITE_OTHER): Payer: Self-pay | Admitting: Family Medicine

## 2016-07-04 ENCOUNTER — Other Ambulatory Visit
Admission: RE | Admit: 2016-07-04 | Discharge: 2016-07-04 | Disposition: A | Discharge status: Home / Self Care | Payer: Self-pay | Source: Ambulatory Visit | Attending: Family Medicine | Admitting: Family Medicine

## 2016-07-04 VITALS — BP 102/62 | HR 72 | Ht 61.0 in | Wt 124.0 lb

## 2016-07-04 DIAGNOSIS — Z30011 Encounter for initial prescription of contraceptive pills: Secondary | ICD-10-CM

## 2016-07-04 DIAGNOSIS — D500 Iron deficiency anemia secondary to blood loss (chronic): Secondary | ICD-10-CM

## 2016-07-04 DIAGNOSIS — D649 Anemia, unspecified: Secondary | ICD-10-CM | POA: Insufficient documentation

## 2016-07-04 DIAGNOSIS — N920 Excessive and frequent menstruation with regular cycle: Secondary | ICD-10-CM

## 2016-07-04 DIAGNOSIS — K2970 Gastritis, unspecified, without bleeding: Secondary | ICD-10-CM

## 2016-07-04 LAB — HEMOGLOBIN: HEMOGLOBIN: 9.9 g/dL — AB (ref 12.0–16.0)

## 2016-07-04 MED ORDER — NORGESTIM-ETH ESTRAD TRIPHASIC 0.18/0.215/0.25 MG-35 MCG PO TABS
1.0000 | ORAL_TABLET | Freq: Every day | ORAL | 11 refills | Status: AC
Start: 2016-07-04 — End: ?

## 2016-07-04 NOTE — Patient Instructions (Addendum)
Hormonal Contraception Information Estrogen and progesterone (progestin) are hormones used in many forms of birth control (contraception). These two hormones make up most hormonal contraceptives. Hormonal contraceptives use either:   A combination of estrogen hormone and progesterone hormone in one of these forms:  Pill--Pills come in various combinations of active hormone pills and nonhormonal pills. Different combinations of pills may give you a period once a month, once every 3 months, or no period at all. It is important to take the pills the same time each day.  Patch--The patch is placed on the lower abdomen every week for 3 weeks. On the fourth week, the patch is not placed.  Vaginal ring--The ring is placed in the vagina and left there for 3 weeks. It is then removed for 1 week.  Progesterone alone in one of these forms:  Pill--Hormone pills are taken every day of the cycle.  Intrauterine device (IUD)--The IUD is inserted during a menstrual period and removed or replaced every 5 years or sooner.  Implant--Plastic rods are placed under the skin of the upper arm. They are removed or replaced every 3 years or sooner.  Injection--The injection is given once every 90 days. Pregnancy can still occur with any of these hormonal contraceptive methods. If you have any suspicion that you might be pregnant, take a pregnancy test and talk to your health care provider.  ESTROGEN AND PROGESTERONE CONTRACEPTIVES Estrogen and progesterone contraceptives can prevent pregnancy by:  Stopping the release of an egg (ovulation).  Thickening the mucus of the cervix, making it difficult for sperm to enter the uterus.  Changing the lining of the uterus. This change makes it more difficult for an egg to implant. Side effects from estrogen occur more often in the first 2-3 months. Talk to your health care provider about what side effects may affect you. If you develop persistent side effects or they are  severe, talk to your health care provider. PROGESTERONE CONTRACEPTIVES Progesterone-only contraceptives can prevent pregnancy by:   Blocking ovulation. This occurs in many women, but some women will continue to ovulate.   Preventing the entry of sperm into the uterus by keeping the cervical mucus thick and sticky.   Changing the lining of the uterus. This change makes it more difficult for an egg to implant.  Side effects of progesterone can vary. Talk to your health care provider about what side effects may affect you. If you develop persistent side effects or they are severe, talk to your health care provider.    This information is not intended to replace advice given to you by your health care provider. Make sure you discuss any questions you have with your health care provider.   Document Released: 11/26/2007 Document Revised: 07/09/2013 Document Reviewed: 04/20/2013 Elsevier Interactive Patient Education [employer].  Contraception Choices Birth control (contraception) is the use of any methods or devices to stop pregnancy from happening. Below are some methods to help avoid pregnancy. HORMONAL BIRTH CONTROL  A small tube put under the skin of the upper arm (implant). The tube can stay in place for 3 years. The implant must be taken out after 3 years.  Shots given every 3 months.  Pills taken every day.  Patches that are changed once a week.  A ring put into the vagina (vaginal ring). The ring is left in place for 3 weeks and removed for 1 week. Then, a new ring is put in the vagina.  Emergency birth control pills taken after unprotected  sex (intercourse). BARRIER BIRTH CONTROL  Oral Contraception Use Oral contraceptive pills (OCPs) are medicines taken to prevent pregnancy. OCPs work by preventing the ovaries from releasing eggs. The hormones in OCPs also cause the cervical mucus to thicken, preventing the sperm from entering the uterus. The hormones also cause the  uterine lining to become thin, not allowing a fertilized egg to attach to the inside of the uterus. OCPs are highly effective when taken exactly as prescribed. However, OCPs do not prevent sexually transmitted diseases (STDs). Safe sex practices, such as using condoms along with an OCP, can help prevent STDs. Before taking OCPs, you may have a physical exam and Pap test. Your health care provider may also order blood tests if necessary. Your health care provider will make sure you are a good candidate for oral contraception. Discuss with your health care provider the possible side effects of the OCP you may be prescribed. When starting an OCP, it can take 2 to 3 months for the body to adjust to the changes in hormone levels in your body.  HOW TO TAKE ORAL CONTRACEPTIVE PILLS Your health care provider may advise you on how to start taking the first cycle of OCPs. Otherwise, you can:  Start on day 1 of your menstrual period. You will not need any backup contraceptive protection with this start time.  Start on the first Sunday after your menstrual period or the day you get your prescription. In these cases, you will need to use backup contraceptive protection for the first week.  Start the pill at any time of your cycle. If you take the pill within 5 days of the start of your period, you are protected against pregnancy right away. In this case, you will not need a backup form of birth control. If you start at any other time of your menstrual cycle, you will need to use another form of birth control for 7 days. If your OCP is the type called a minipill, it will protect you from pregnancy after taking it for 2 days (48 hours). After you have started taking OCPs:  If you forget to take 1 pill, take it as soon as you remember. Take the next pill at the regular time.  If you miss 2 or more pills, call your health care provider because different pills have different instructions for missed doses. Use backup birth  control until your next menstrual period starts.  If you use a 28-day pack that contains inactive pills and you miss 1 of the last 7 pills (pills with no hormones), it will not matter. Throw away the rest of the non-hormone pills and start a new pill pack.  No matter which day you start the OCP, you will always start a new pack on that same day of the week. Have an extra pack of OCPs and a backup contraceptive method available in case you miss some pills or lose your OCP pack.  HOME CARE INSTRUCTIONS  Do not smoke.  Always use a condom to protect against STDs. OCPs do not protect against STDs.  Use a calendar to mark your menstrual period days.  Read the information and directions that came with your OCP. Talk to your health care provider if you have questions.  SEEK MEDICAL CARE IF:  You develop nausea and vomiting.  You have abnormal vaginal discharge or bleeding.  You develop a rash.  You miss your menstrual period.  You are losing your hair.  You need treatment for mood  swings or depression.  You get dizzy when taking the OCP.  You develop acne from taking the OCP.  You become pregnant.  SEEK IMMEDIATE MEDICAL CARE IF:  You develop chest pain.  You develop shortness of breath.  You have an uncontrolled or severe headache.  You develop numbness or slurred speech.  You develop visual problems.  You develop pain, redness, and swelling in the legs.    This information is not intended to replace advice given to you by your health care provider. Make sure you discuss any questions you have with your health care provider.   Document Released: 10/26/2011 Document Revised: 11/27/2014 Document Reviewed: 04/27/2013 Elsevier Interactive Patient Education 2016 [place] thin covering worn on the penis (female condom) during sex.  A soft, loose covering put into the vagina (female condom) before sex.  A rubber bowl that sits over the cervix (diaphragm). The bowl  must be made for you. The bowl is put into the vagina before sex. The bowl is left in place for 6 to 8 hours after sex.  A small, soft cup that fits over the cervix (cervical cap). The cup must be made for you. The cup can be left in place for 48 hours after sex.  A sponge that is put into the vagina before sex.  A chemical that kills or stops sperm from getting into the cervix and uterus (spermicide). The chemical may be a cream, jelly, foam, or pill. INTRAUTERINE (IUD) BIRTH CONTROL   IUD birth control is a small, T-shaped piece of plastic. The plastic is put inside the uterus. There are 2 types of IUD:  Copper IUD. The IUD is covered in copper wire. The copper makes a fluid that kills sperm. It can stay in place for 10 years.  Hormone IUD. The hormone stops pregnancy from happening. It can stay in place for 5 years. PERMANENT METHODS  When the woman has her fallopian tubes sealed, tied, or blocked during surgery. This stops the egg from traveling to the uterus.  The doctor places a small coil or insert into each fallopian tube. This causes scar tissue to form and blocks the fallopian tubes.  When the female has the tubes that carry sperm tied off (vasectomy). NATURAL FAMILY PLANNING BIRTH CONTROL   Natural family planning means not having sex or using barrier birth control on the days the woman could become pregnant.  Use a calendar to keep track of the length of each period and know the days she can get pregnant.  Avoid sex during ovulation.  Use a thermometer to measure body temperature. Also watch for symptoms of ovulation.  Time sex to be after the woman has ovulated. Use condoms to help protect yourself against sexually transmitted infections (STIs). Do this no matter what type of birth control you use. Talk to your doctor about which type of birth control is best for you.   This information is not intended to replace advice given to you by your health care provider. Make sure  you discuss any questions you have with your health care provider.   Document Released: 09/03/2009 Document Revised: 11/11/2013 Document Reviewed: 05/28/2013 Elsevier Interactive Patient Education [employer].

## 2016-07-04 NOTE — Progress Notes (Signed)
Name: [name]   MRN: [id number]    DOB: 1976/04/22   Date:07/04/2016       Progress Note  Subjective  Chief Complaint  Chief Complaint  Patient presents with  . Anemia    needs cbc repeated due to HGB 5.9- received 2 pints of blood    Patient expresses desire to go on "birth control pills"   Anemia  Presents for follow-up visit. There has been no abdominal pain, anorexia, bruising/bleeding easily, confusion, fever, leg swelling, light-headedness, malaise/fatigue, pallor, palpitations, paresthesias or weight loss. Signs of blood loss that are present include menorrhagia and vaginal bleeding. Signs of blood loss that are not present include hematemesis, hematochezia and melena. (Presently on period)  Compliance problems: under standing seriousness of problem.  Side effects of medications include fatigue.    No problem-specific Assessment & Plan notes found for this encounter.   Past Medical History:  Diagnosis Date  . Anemia    H/O 20 YRS AGO  . Hypothyroidism   . Hypothyroidism   . Kidney stone   . Preterm labor   . Thyroid disease     Past Surgical History:  Procedure Laterality Date  . CYSTOSCOPY/URETEROSCOPY/HOLMIUM LASER/STENT PLACEMENT Right 07/05/2015   Procedure: CYSTOSCOPY/URETEROSCOPY/HOLMIUM LASER/STENT PLACEMENT;  Surgeon: Hollice Espy, MD;  Location: [facility] ORS;  Service: Urology;  Laterality: Right;  . hemrrhoidectomy     age 47 or 59    Family History  Problem Relation Age of Onset  . Stroke Father   . Other Neg Hx     Social History   Social History  . Marital status: Married    Spouse name: N/A  . Number of children: N/A  . Years of education: N/A   Occupational History  . Not on file.   Social History Main Topics  . Smoking status: Never Smoker  . Smokeless tobacco: Never Used  . Alcohol use No  . Drug use: No  . Sexual activity: Yes    Partners: Male    Birth control/ protection: Condom   Other Topics Concern  . Not on file   Social  History Narrative  . No narrative on file    Allergies  Allergen Reactions  . Oxybutynin Other (See Comments)    Bleeding gums and urinary retention  . Oxycodone Nausea And Vomiting     Review of Systems  Constitutional: Negative for chills, fever, malaise/fatigue and weight loss.  HENT: Negative for ear discharge, ear pain and sore throat.   Eyes: Negative for blurred vision.  Respiratory: Negative for cough, hemoptysis, sputum production, shortness of breath and wheezing.   Cardiovascular: Negative for chest pain, palpitations and leg swelling.  Gastrointestinal: Negative for abdominal pain, anorexia, blood in stool, constipation, diarrhea, heartburn, hematemesis, hematochezia, melena and nausea.  Genitourinary: Positive for menorrhagia and vaginal bleeding. Negative for dysuria, flank pain, frequency, hematuria and urgency.       On period 2 ppd/ periods generally heavy/ no intermenstrual bleed  Musculoskeletal: Negative for back pain, joint pain, myalgias and neck pain.  Skin: Negative for pallor and rash.  Neurological: Negative for dizziness, tingling, sensory change, focal weakness, light-headedness, headaches and paresthesias.  Endo/Heme/Allergies: Negative for environmental allergies and polydipsia. Does not bruise/bleed easily.  Psychiatric/Behavioral: Negative for confusion, depression and suicidal ideas. The patient is not nervous/anxious and does not have insomnia.      Objective  Vitals:   07/04/16 1111  BP: 102/62  Pulse: 72  Weight: 124 lb (56.2 kg)  Height: 5\' 1"  (1.549  m)    Physical Exam  Constitutional: She is well-developed, well-nourished, and in no distress. No distress.  HENT:  Head: Normocephalic and atraumatic.  Right Ear: External ear normal.  Left Ear: External ear normal.  Nose: Nose normal.  Mouth/Throat: Oropharynx is clear and moist.  Eyes: Conjunctivae and EOM are normal. Pupils are equal, round, and reactive to light. Right eye exhibits  no discharge. Left eye exhibits no discharge.  Neck: Normal range of motion. Neck supple. No JVD present. No thyromegaly present.  Cardiovascular: Normal rate, regular rhythm, normal heart sounds and intact distal pulses.  Exam reveals no gallop and no friction rub.   No murmur heard. Pulmonary/Chest: Effort normal and breath sounds normal. She has no wheezes. She has no rales.  Abdominal: Soft. Bowel sounds are normal. She exhibits no mass. There is no tenderness. There is no guarding.  Musculoskeletal: Normal range of motion. She exhibits no edema.  Lymphadenopathy:    She has no cervical adenopathy.  Neurological: She is alert. She has normal reflexes.  Skin: Skin is warm and dry. She is not diaphoretic.  Psychiatric: Mood and affect normal.  Nursing note and vitals reviewed.     Assessment & Plan  Problem List Items Addressed This Visit    None    Visit Diagnoses    Iron deficiency anemia due to chronic blood loss    -  Primary   cbc downstairs   Gastritis       Menorrhagia with regular cycle       Encounter for initial prescription of contraceptive pills       trisprintec or seasonale/ call pharmacy        Dr. Otilio Miu [facility]  07/04/16

## 2016-07-21 ENCOUNTER — Other Ambulatory Visit: Payer: Self-pay

## 2016-08-08 ENCOUNTER — Ambulatory Visit: Payer: Self-pay | Admitting: Family Medicine

## 2016-08-15 ENCOUNTER — Ambulatory Visit: Payer: Self-pay | Admitting: Family Medicine
